# Patient Record
Sex: Female | Born: 1985 | Race: White | Hispanic: No | Marital: Single | Smoking: Never smoker
Health system: Southern US, Community
[De-identification: ages and names within clinical notes are randomized; demographics above are authoritative.]

## PROBLEM LIST (undated history)

## (undated) ENCOUNTER — Inpatient Hospital Stay (HOSPITAL_COMMUNITY): Payer: Self-pay

## (undated) DIAGNOSIS — R002 Palpitations: Secondary | ICD-10-CM

## (undated) DIAGNOSIS — F419 Anxiety disorder, unspecified: Secondary | ICD-10-CM

## (undated) DIAGNOSIS — B2790 Infectious mononucleosis, unspecified without complication: Secondary | ICD-10-CM

## (undated) DIAGNOSIS — R079 Chest pain, unspecified: Secondary | ICD-10-CM

## (undated) HISTORY — DX: Anxiety disorder, unspecified: F41.9

## (undated) HISTORY — PX: WISDOM TOOTH EXTRACTION: SHX21

## (undated) HISTORY — PX: TONSILLECTOMY AND ADENOIDECTOMY: SUR1326

## (undated) HISTORY — PX: ADENOIDECTOMY: SUR15

## (undated) HISTORY — DX: Chest pain, unspecified: R07.9

## (undated) HISTORY — PX: NASAL SINUS SURGERY: SHX719

## (undated) HISTORY — DX: Palpitations: R00.2

---

## 2013-07-27 ENCOUNTER — Encounter: Payer: Self-pay | Admitting: *Deleted

## 2013-07-29 ENCOUNTER — Ambulatory Visit: Payer: Self-pay | Admitting: Cardiology

## 2013-09-16 ENCOUNTER — Ambulatory Visit (INDEPENDENT_AMBULATORY_CARE_PROVIDER_SITE_OTHER): Payer: BC Managed Care – PPO | Admitting: Cardiovascular Disease

## 2013-09-16 ENCOUNTER — Encounter: Payer: Self-pay | Admitting: Cardiovascular Disease

## 2013-09-16 VITALS — BP 106/80 | HR 67 | Ht 63.0 in | Wt 140.0 lb

## 2013-09-16 DIAGNOSIS — R0602 Shortness of breath: Secondary | ICD-10-CM

## 2013-09-16 DIAGNOSIS — F411 Generalized anxiety disorder: Secondary | ICD-10-CM

## 2013-09-16 DIAGNOSIS — R079 Chest pain, unspecified: Secondary | ICD-10-CM | POA: Insufficient documentation

## 2013-09-16 DIAGNOSIS — R002 Palpitations: Secondary | ICD-10-CM | POA: Insufficient documentation

## 2013-09-16 DIAGNOSIS — F419 Anxiety disorder, unspecified: Secondary | ICD-10-CM | POA: Insufficient documentation

## 2013-09-16 NOTE — Progress Notes (Signed)
Patient ID: Kimberly [name], female   DOB: March 01, 1986, 28 y.o.   MRN: [id number]   28 yo [facility] for chest pain 07/25/13  Thought she had UTI and was on some antibiotics ? Augmentin that was upsetting her stomach.  Pain was sharp and lasted minutes. 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  No coronary risk factors no meds.  Pain not exertional.  No pleuritic component.  Continues to have intermitant pains since being seen in urgent care  Also describes weekly palpitations where her heart just doesn't feel rate and pulse is too fast.  No syncope.  Denies drugs or excess caffeine or stimulants.  Walks but otherwise sedentary.  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  All other systems reviewed and negative   General: Affect appropriate Healthy:  appears stated age HEENT: normal Neck supple with no adenopathy JVP normal no bruits no thyromegaly Lungs clear with no wheezing and good diaphragmatic motion Heart:  S1/S2 no murmur,rub, gallop or click PMI normal Abdomen: benighn, BS positve, no tenderness, no AAA no bruit.  No HSM or HJR Distal pulses intact with no bruits No edema Neuro non-focal Skin warm and dry No muscular weakness  Medications Current Outpatient Prescriptions  Medication Sig Dispense Refill  . ALPRAZolam (XANAX PO) Take by mouth.      . ESCITALOPRAM OXALATE PO Take by mouth.      Marland Kitchen. GABAPENTIN PO Take by mouth.      . ORSYTHIA 0.1-20 MG-MCG tablet       . oxymetazoline (AFRIN) 0.05 % nasal spray Place 1 spray into both nostrils 2 (two) times daily.       No current  facility-administered medications for this visit.    Allergies Augmentin; Sulfa antibiotics; and Codeine  Family History: No family history on file.  Social History: History   Social History  . Marital Status: Single    Spouse Name: N/A    Number of Children: N/A  . Years of Education: N/A   Occupational History  . Not on file.   Social History Main Topics  . Smoking status: Never Smoker   . Smokeless tobacco: Never Used  . Alcohol Use: No  . Drug Use: Not on file  . Sexual Activity: Not on file   Other Topics Concern  . Not on file   Social History Narrative  . No narrative on file    Electrocardiogram:  Urgent Care 07/25/13  normla precordial leads no limb leads seen Today Sinus rate 67 normal ECG   Assessment and Plan

## 2013-09-16 NOTE — Assessment & Plan Note (Signed)
F/U primary seems to be root of problem

## 2013-09-16 NOTE — Patient Instructions (Signed)
Your physician recommends that you schedule a follow-up appointment in: AS NEEDED  Your physician recommends that you continue on your current medications as directed. Please refer to the Current Medication list given to you today. Your physician has recommended that you wear an event monitor. Event monitors are medical devices that record the heart's electrical activity. Doctors most often us these monitors to diagnose arrhythmias. Arrhythmias are problems with the speed or rhythm of the heartbeat. The monitor is a small, portable device. You can wear one while you do your normal daily activities. This is usually used to diagnose what is causing palpitations/syncope (passing out).  Your physician recommends that you return for lab work in:  TODAY  BMET   CBC  TSH  FREE T4  Your physician has requested that you have an exercise tolerance test. For further information please visit [email or web address]. Please also follow instruction sheet, as given.

## 2013-09-16 NOTE — Assessment & Plan Note (Addendum)
Benign sounding but persistent and frequent enough to order event monitor.  Normal exam and ECG suggest not significant Since she does not have primary will check BMET/CBC with PLT (spleen)  TSH and T4

## 2013-09-16 NOTE — Assessment & Plan Note (Signed)
Atypical with normal ECG F/U ETT  

## 2013-09-17 LAB — CBC WITH DIFFERENTIAL/PLATELET
BASOS ABS: 0.1 10*3/uL (ref 0.0–0.1)
Basophils Relative: 1 % (ref 0–1)
EOS PCT: 1 % (ref 0–5)
Eosinophils Absolute: 0.1 10*3/uL (ref 0.0–0.7)
HCT: 36.3 % (ref 36.0–46.0)
Hemoglobin: 12.6 g/dL (ref 12.0–15.0)
Lymphocytes Relative: 30 % (ref 12–46)
Lymphs Abs: 1.7 10*3/uL (ref 0.7–4.0)
MCH: 30.4 pg (ref 26.0–34.0)
MCHC: 34.7 g/dL (ref 30.0–36.0)
MCV: 87.7 fL (ref 78.0–100.0)
MONO ABS: 0.3 10*3/uL (ref 0.1–1.0)
Monocytes Relative: 6 % (ref 3–12)
Neutro Abs: 3.5 10*3/uL (ref 1.7–7.7)
Neutrophils Relative %: 62 % (ref 43–77)
PLATELETS: 215 10*3/uL (ref 150–400)
RBC: 4.14 MIL/uL (ref 3.87–5.11)
RDW: 13.7 % (ref 11.5–15.5)
WBC: 5.6 10*3/uL (ref 4.0–10.5)

## 2013-09-17 LAB — BASIC METABOLIC PANEL
BUN: 14 mg/dL (ref 6–23)
CALCIUM: 9.3 mg/dL (ref 8.4–10.5)
CO2: 24 meq/L (ref 19–32)
CREATININE: 0.66 mg/dL (ref 0.50–1.10)
Chloride: 99 mEq/L (ref 96–112)
Glucose, Bld: 79 mg/dL (ref 70–99)
Potassium: 4.4 mEq/L (ref 3.5–5.3)
Sodium: 133 mEq/L — ABNORMAL LOW (ref 135–145)

## 2013-09-17 LAB — TSH: TSH: 1.538 u[IU]/mL (ref 0.350–4.500)

## 2013-09-17 LAB — T4, FREE: FREE T4: 1 ng/dL (ref 0.80–1.80)

## 2013-09-26 ENCOUNTER — Telehealth: Payer: Self-pay | Admitting: Cardiovascular Disease

## 2013-09-26 NOTE — Telephone Encounter (Signed)
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

## 2013-09-26 NOTE — Telephone Encounter (Signed)
Notified of lab results. 

## 2013-10-12 ENCOUNTER — Ambulatory Visit (HOSPITAL_COMMUNITY)
Admission: RE | Admit: 2013-10-12 | Discharge: 2013-10-12 | Disposition: A | Discharge status: Home / Self Care | Payer: BC Managed Care – PPO | Source: Ambulatory Visit | Attending: Internal Medicine | Admitting: Internal Medicine

## 2013-10-12 DIAGNOSIS — R002 Palpitations: Secondary | ICD-10-CM

## 2013-10-12 DIAGNOSIS — R0602 Shortness of breath: Secondary | ICD-10-CM | POA: Insufficient documentation

## 2013-10-12 DIAGNOSIS — R079 Chest pain, unspecified: Secondary | ICD-10-CM | POA: Insufficient documentation

## 2013-11-01 ENCOUNTER — Encounter: Payer: Self-pay | Admitting: *Deleted

## 2015-02-26 ENCOUNTER — Encounter: Payer: BC Managed Care – PPO | Admitting: Cardiovascular Disease

## 2015-02-26 NOTE — Progress Notes (Signed)
Patient ID: Kimberly [name], female   DOB: 09-Jan-1986, 29 y.o.   MRN: [id number]   29 y.o.  seen at [facility] urgent care for chest pain 07/25/13  Thought she had UTI and was on some antibiotics ? Augmentin that was upsetting her stomach.  Pain was sharp and lasted minutes. 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  No coronary risk factors no meds.  Pain not exertional.  No pleuritic component.  Continues to have intermitant pains since being seen in urgent care  Also describes weekly palpitations where her heart just doesn't feel rate and pulse is too fast.  No syncope.  Denies drugs or excess caffeine or stimulants.  Walks but otherwise sedentary.  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  All other systems reviewed and negative   General: Affect appropriate Healthy:  appears stated age HEENT: normal Neck supple with no adenopathy JVP normal no bruits no thyromegaly Lungs clear with no wheezing and good diaphragmatic motion Heart:  S1/S2 no murmur,rub, gallop or click PMI normal Abdomen: benighn, BS positve, no tenderness, no AAA no bruit.  No HSM or HJR Distal pulses intact with no bruits No edema Neuro non-focal Skin warm and dry No muscular weakness  Medications Current Outpatient Prescriptions  Medication Sig Dispense Refill  . ALPRAZolam (XANAX PO) Take by mouth.    . ESCITALOPRAM OXALATE PO Take by mouth.    Marland Kitchen GABAPENTIN PO Take by mouth.    . ORSYTHIA 0.1-20 MG-MCG tablet     . oxymetazoline (AFRIN) 0.05 % nasal spray Place 1 spray into both nostrils 2  (two) times daily.     No current facility-administered medications for this visit.    Allergies Augmentin; Sulfa antibiotics; and Codeine  Family History: Family History  Problem Relation Age of Onset  . Asthma Mother   . Thyroid disease Father   . Depression Father   . Anxiety disorder Father   . Anxiety disorder Sister   . Other Father     B12 DEFICIENCY  . Arrhythmia Maternal Grandmother     AFIB  . Stroke Maternal Grandmother   . Cancer Paternal Grandfather     COLON  . Heart Problems Maternal Grandmother     PACER    Social History: Social History   Social History  . Marital Status: Single    Spouse Name: N/A  . Number of Children: N/A  . Years of Education: N/A   Occupational History  . Not on file.   Social History Main Topics  . Smoking status: Never Smoker   . Smokeless tobacco: Never Used  . Alcohol Use: No  . Drug Use: Not on file  . Sexual Activity: Not on file   Other Topics Concern  . Not on file   Social History Narrative    Electrocardiogram:  Urgent Care 07/25/13  normla precordial leads no limb leads seen 07/2013  Sinus rate 67 normal ECG   Assessment and Plan

## 2015-03-13 ENCOUNTER — Telehealth: Payer: Self-pay | Admitting: Obstetrics

## 2015-03-14 NOTE — Telephone Encounter (Signed)
03/13/2015 - Patient called back and scheduled NOB appointment. brm

## 2015-03-22 ENCOUNTER — Other Ambulatory Visit: Payer: Self-pay | Admitting: Certified Nurse Midwife

## 2015-03-22 ENCOUNTER — Ambulatory Visit (INDEPENDENT_AMBULATORY_CARE_PROVIDER_SITE_OTHER): Payer: BC Managed Care – PPO | Admitting: Certified Nurse Midwife

## 2015-03-22 ENCOUNTER — Ambulatory Visit (INDEPENDENT_AMBULATORY_CARE_PROVIDER_SITE_OTHER): Payer: BC Managed Care – PPO

## 2015-03-22 ENCOUNTER — Encounter: Payer: Self-pay | Admitting: Obstetrics

## 2015-03-22 ENCOUNTER — Encounter: Payer: Self-pay | Admitting: Certified Nurse Midwife

## 2015-03-22 VITALS — BP 119/78 | HR 113 | Temp 98.9°F | Wt 144.0 lb

## 2015-03-22 DIAGNOSIS — Z3401 Encounter for supervision of normal first pregnancy, first trimester: Secondary | ICD-10-CM

## 2015-03-22 DIAGNOSIS — O219 Vomiting of pregnancy, unspecified: Secondary | ICD-10-CM

## 2015-03-22 DIAGNOSIS — N760 Acute vaginitis: Secondary | ICD-10-CM

## 2015-03-22 DIAGNOSIS — O2690 Pregnancy related conditions, unspecified, unspecified trimester: Secondary | ICD-10-CM

## 2015-03-22 LAB — POCT URINALYSIS DIPSTICK
BILIRUBIN UA: NEGATIVE
Blood, UA: NEGATIVE
Glucose, UA: NEGATIVE
Ketones, UA: NEGATIVE
LEUKOCYTES UA: NEGATIVE
Nitrite, UA: NEGATIVE
PH UA: 5
PROTEIN UA: NEGATIVE
SPEC GRAV UA: 1.02
Urobilinogen, UA: NEGATIVE

## 2015-03-22 MED ORDER — DOXYLAMINE-PYRIDOXINE 10-10 MG PO TBEC: DELAYED_RELEASE_TABLET | ORAL | Status: DC

## 2015-03-22 MED ORDER — MICONAZOLE NITRATE 1200 & 2 MG & % VA KIT: 1.0000 | PACK | Freq: Once | VAGINAL | Status: DC

## 2015-03-22 NOTE — Progress Notes (Signed)
Subjective:    Kimberly [name] is being seen today for her first obstetrical visit.  This is a planned pregnancy. She is at [redacted]w[redacted]d gestation. Her obstetrical history is significant for none. 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 Patient does intend to breast feed. Pregnancy history fully reviewed.  The information documented in the HPI was reviewed and verified.  Menstrual History: OB History    Gravida Para Term Preterm AB TAB SAB Ectopic Multiple Living   1               Menarche age: 57  Patient's last menstrual period was 02/08/2015.  Sure of LMP    Past Medical History  Diagnosis Date  . Anxiety   . Palpitations   . SOB (shortness of breath)   . Chest pain     Past Surgical History  Procedure Laterality Date  . Nasal sinus surgery    . Tonsillectomy and adenoidectomy       (Not in a hospital admission) Allergies  Allergen Reactions  . Augmentin [Amoxicillin-Pot Clavulanate]   . Sulfa Antibiotics Nausea And Vomiting  . Codeine Rash    Social History  Substance Use Topics  . Smoking status: Never Smoker   . Smokeless tobacco: Never Used  . Alcohol Use: No    Family History  Problem Relation Age of Onset  . Asthma Mother   . Thyroid disease Father   . Depression Father   . Anxiety disorder Father   . Anxiety disorder Sister   . Other Father     B12 DEFICIENCY  . Arrhythmia Maternal Grandmother     AFIB  . Stroke Maternal Grandmother   . Cancer Paternal Grandfather     COLON  . Heart Problems Maternal Grandmother     PACER     Review of Systems Constitutional: negative for weight loss Gastrointestinal: + for nausea & vomiting Genitourinary:negative for genital lesions and vaginal discharge and dysuria Musculoskeletal:negative for back pain Behavioral/Psych: negative for abusive relationship, depression, illegal drug usage and tobacco use    Objective:    BP 119/78 mmHg  Pulse 113  Temp(Src) 98.9 F (37.2 C)  Wt 144 lb  (65.318 kg)  LMP 02/08/2015 General Appearance:    Alert, cooperative, no distress, appears stated age  Head:    Normocephalic, without obvious abnormality, atraumatic  Eyes:    PERRL, conjunctiva/corneas clear, EOM's intact, fundi    benign, both eyes  Ears:    Normal TM's and external ear canals, both ears  Nose:   Nares normal, septum midline, mucosa normal, no drainage    or sinus tenderness  Throat:   Lips, mucosa, and tongue normal; teeth and gums normal  Neck:   Supple, symmetrical, trachea midline, no adenopathy;    thyroid:  no enlargement/tenderness/nodules; no carotid   bruit or JVD  Back:     Symmetric, no curvature, ROM normal, no CVA tenderness  Lungs:     Clear to auscultation bilaterally, respirations unlabored  Chest Wall:    No tenderness or deformity   Heart:    Regular rate and rhythm, S1 and S2 normal, no murmur, rub   or gallop  Breast Exam:    No tenderness, masses, or nipple abnormality  Abdomen:     Soft, non-tender, bowel sounds active all four quadrants,    no masses, no organomegaly  Genitalia:    Normal female without lesion, discharge or tenderness  Extremities:   Extremities normal, atraumatic, no cyanosis or edema  Pulses:   2+ and symmetric all extremities  Skin:   Skin color, texture, turgor normal, no rashes or lesions  Lymph nodes:   Cervical, supraclavicular, and axillary nodes normal  Neurologic:   CNII-XII intact, normal strength, sensation and reflexes    throughout      Cervix:     Lab Review Urine pregnancy test Labs reviewed yes Radiologic studies reviewed no Assessment:    Pregnancy at [redacted]w[redacted]d weeks   N&V in early pregnancy  Plan:     Prenatal vitamins.  Counseling provided regarding continued use of seat belts, cessation of alcohol consumption, smoking or use of illicit drugs; infection precautions i.e., influenza/TDAP immunizations, toxoplasmosis,CMV, parvovirus, listeria and varicella; workplace safety, exercise during pregnancy;  routine dental care, safe medications, sexual activity, hot tubs, saunas, pools, travel, caffeine use, fish and methlymercury, potential toxins, hair treatments, varicose veins Weight gain recommendations per IOM guidelines reviewed: underweight/BMI< 18.5--> gain 28 - 40 lbs; normal weight/BMI 18.5 - 24.9--> gain 25 - 35 lbs; overweight/BMI 25 - 29.9--> gain 15 - 25 lbs; obese/BMI >30->gain  11 - 20 lbs Problem list reviewed and updated. FIRST/CF mutation testing/NIPT/QUAD SCREEN/fragile X/Ashkenazi Jewish population testing/Spinal muscular atrophy discussed: requested. Role of ultrasound in pregnancy discussed; fetal survey: requested. Amniocentesis discussed: not indicated. VBAC calculator score: VBAC consent form provided No orders of the defined types were placed in this encounter.   Orders Placed This Encounter  Procedures  . Culture, OB Urine  . Obstetric panel  . HIV antibody  . Varicella zoster antibody, IgG  . Vit D  25 hydroxy (rtn osteoporosis monitoring)  . POCT urinalysis dipstick    Follow up in 4 weeks. 50% of 30 min visit spent on counseling and coordination of care.

## 2015-03-23 LAB — OBSTETRIC PANEL
Antibody Screen: NEGATIVE
BASOS ABS: 0 10*3/uL (ref 0.0–0.1)
Basophils Relative: 0 % (ref 0–1)
Eosinophils Absolute: 0.1 10*3/uL (ref 0.0–0.7)
Eosinophils Relative: 1 % (ref 0–5)
HCT: 33.5 % — ABNORMAL LOW (ref 36.0–46.0)
Hemoglobin: 11.7 g/dL — ABNORMAL LOW (ref 12.0–15.0)
Hepatitis B Surface Ag: NEGATIVE
LYMPHS ABS: 1.3 10*3/uL (ref 0.7–4.0)
Lymphocytes Relative: 18 % (ref 12–46)
MCH: 30.7 pg (ref 26.0–34.0)
MCHC: 34.9 g/dL (ref 30.0–36.0)
MCV: 87.9 fL (ref 78.0–100.0)
MPV: 10.9 fL (ref 8.6–12.4)
Monocytes Absolute: 0.4 10*3/uL (ref 0.1–1.0)
Monocytes Relative: 5 % (ref 3–12)
NEUTROS ABS: 5.6 10*3/uL (ref 1.7–7.7)
NEUTROS PCT: 76 % (ref 43–77)
PLATELETS: 216 10*3/uL (ref 150–400)
RBC: 3.81 MIL/uL — ABNORMAL LOW (ref 3.87–5.11)
RDW: 13.8 % (ref 11.5–15.5)
RUBELLA: 2.85 {index} — AB (ref <0.90)
Rh Type: NEGATIVE
WBC: 7.4 10*3/uL (ref 4.0–10.5)

## 2015-03-23 LAB — HIV ANTIBODY (ROUTINE TESTING W REFLEX): HIV: NONREACTIVE

## 2015-03-23 LAB — VARICELLA ZOSTER ANTIBODY, IGG: Varicella IgG: 1518 Index — ABNORMAL HIGH (ref <135.00)

## 2015-03-23 LAB — VITAMIN D 25 HYDROXY (VIT D DEFICIENCY, FRACTURES): Vit D, 25-Hydroxy: 22 ng/mL — ABNORMAL LOW (ref 30–100)

## 2015-03-24 LAB — CULTURE, OB URINE
COLONY COUNT: NO GROWTH
Organism ID, Bacteria: NO GROWTH

## 2015-03-27 ENCOUNTER — Telehealth: Payer: Self-pay | Admitting: *Deleted

## 2015-03-27 LAB — SURESWAB, VAGINOSIS/VAGINITIS PLUS
ATOPOBIUM VAGINAE: NOT DETECTED Log (cells/mL)
C. ALBICANS, DNA: NOT DETECTED
C. GLABRATA, DNA: NOT DETECTED
C. PARAPSILOSIS, DNA: NOT DETECTED
C. TRACHOMATIS RNA, TMA: NOT DETECTED
C. TROPICALIS, DNA: NOT DETECTED
Gardnerella vaginalis: NOT DETECTED Log (cells/mL)
LACTOBACILLUS SPECIES: 4.8 Log (cells/mL)
MEGASPHAERA SPECIES: NOT DETECTED Log (cells/mL)
N. gonorrhoeae RNA, TMA: NOT DETECTED
T. VAGINALIS RNA, QL TMA: NOT DETECTED

## 2015-03-27 LAB — PAP, TP IMAGING W/ HPV RNA, RFLX HPV TYPE 16,18/45: HPV mRNA, High Risk: NOT DETECTED

## 2015-03-27 NOTE — Telephone Encounter (Signed)
The flea medication is ok for her as long as she washes her hands after application and makes sure not to get it in her eyes.  Thank you.

## 2015-03-27 NOTE — Telephone Encounter (Signed)
Patient wants to put flea medication on her pet and wants to know if the liquid on the back is harmful to her - or should she use a different kind?

## 2015-03-27 NOTE — Telephone Encounter (Signed)
Patient notified

## 2015-04-09 ENCOUNTER — Other Ambulatory Visit: Payer: Self-pay | Admitting: Obstetrics

## 2015-04-09 ENCOUNTER — Telehealth: Payer: Self-pay | Admitting: *Deleted

## 2015-04-09 DIAGNOSIS — O219 Vomiting of pregnancy, unspecified: Secondary | ICD-10-CM

## 2015-04-09 MED ORDER — METOCLOPRAMIDE HCL 5 MG PO TABS: 5.0000 mg | ORAL_TABLET | Freq: Three times a day (TID) | ORAL | Status: DC

## 2015-04-09 NOTE — Telephone Encounter (Signed)
Patient states she has been in once and was treated for nausea- the medication seemed to work, but it is not working any more. She wants to know what to do. Per Dr Clearance Coots review- can add Reglan 5 mg 3xd and see if that helps. Escribed to patient's pharmacy. Tried to call patient back and no answer/VM full.

## 2015-04-13 ENCOUNTER — Other Ambulatory Visit: Payer: Self-pay | Admitting: Certified Nurse Midwife

## 2015-04-13 DIAGNOSIS — F419 Anxiety disorder, unspecified: Principal | ICD-10-CM

## 2015-04-13 DIAGNOSIS — O219 Vomiting of pregnancy, unspecified: Secondary | ICD-10-CM

## 2015-04-13 DIAGNOSIS — F329 Major depressive disorder, single episode, unspecified: Secondary | ICD-10-CM

## 2015-04-13 MED ORDER — CITALOPRAM HYDROBROMIDE 20 MG PO TABS: 20.0000 mg | ORAL_TABLET | Freq: Every day | ORAL | Status: DC

## 2015-04-13 MED ORDER — PROMETHAZINE HCL 25 MG RE SUPP: 25.0000 mg | Freq: Four times a day (QID) | RECTAL | Status: DC | PRN

## 2015-04-13 MED ORDER — PROMETHAZINE HCL 25 MG PO TABS: 25.0000 mg | ORAL_TABLET | Freq: Four times a day (QID) | ORAL | Status: DC | PRN

## 2015-04-18 ENCOUNTER — Ambulatory Visit (INDEPENDENT_AMBULATORY_CARE_PROVIDER_SITE_OTHER): Payer: BC Managed Care – PPO | Admitting: Certified Nurse Midwife

## 2015-04-18 VITALS — BP 115/73 | HR 78 | Temp 98.8°F | Wt 147.0 lb

## 2015-04-18 DIAGNOSIS — Z3401 Encounter for supervision of normal first pregnancy, first trimester: Secondary | ICD-10-CM

## 2015-04-19 NOTE — Progress Notes (Signed)
  Subjective:    Kimberly [name] is a 29 y.o. female being seen today for her obstetrical visit. She is at [redacted]w[redacted]d gestation. Patient reports: no complaints.  States that her anxiety and depression are better after switching medications and that the nausea is improved with the phenergan.    Problem List Items Addressed This Visit    None    Visit Diagnoses    Encounter for supervision of normal first pregnancy in first trimester    -  Primary    Relevant Orders    POCT urinalysis dipstick      Patient Active Problem List   Diagnosis Date Noted  . Anxiety 09/16/2013  . Palpitations 09/16/2013  . Chest pain 09/16/2013    Objective:     BP 115/73 mmHg  Pulse 78  Temp(Src) 98.8 F (37.1 C)  Wt 147 lb (66.679 kg)  LMP 02/08/2015 Uterine Size: Below umbilicus   FHR: 155 by doppler  Assessment:    Pregnancy @ 538w0d  weeks Doing well    Plan:    Problem list reviewed and updated. Labs reviewed.  Follow up in 4 weeks. FIRST/CF mutation testing/NIPT/QUAD SCREEN/fragile X/Ashkenazi Jewish population testing/Spinal muscular atrophy discussed: requested. Role of ultrasound in pregnancy discussed; fetal survey: requested. Amniocentesis discussed: not indicated. 50% of 15 minute visit spent on counseling and coordination of care.

## 2015-04-23 ENCOUNTER — Telehealth: Payer: Self-pay

## 2015-04-23 NOTE — Telephone Encounter (Signed)
SPOKE WITH PATIENT REGARDING PMT PLAN WITH [employer] - EXPLAINED WOULD BE 213.50 STARTING IN NOVEMBER AT HER NEXT VISIT - SHE HAS 30% RESPONSIBILITY AS INSURANCE ONLY COVERS 70%

## 2015-05-10 ENCOUNTER — Encounter: Payer: Self-pay | Admitting: *Deleted

## 2015-05-11 ENCOUNTER — Ambulatory Visit (INDEPENDENT_AMBULATORY_CARE_PROVIDER_SITE_OTHER): Payer: BC Managed Care – PPO | Admitting: Cardiovascular Disease

## 2015-05-11 ENCOUNTER — Encounter: Payer: Self-pay | Admitting: Cardiovascular Disease

## 2015-05-11 VITALS — BP 100/60 | HR 75 | Ht 63.0 in | Wt 150.8 lb

## 2015-05-11 DIAGNOSIS — R002 Palpitations: Secondary | ICD-10-CM | POA: Diagnosis not present

## 2015-05-11 NOTE — Patient Instructions (Signed)
Medication Instructions:  Your physician recommends that you continue on your current medications as directed. Please refer to the Current Medication list given to you today.  Labwork: NONE  Testing/Procedures: Your physician has requested that you have an echocardiogram. Echocardiography is a painless test that uses sound waves to create images of your heart. It provides your doctor with information about the size and shape of your heart and how well your heart's chambers and valves are working. This procedure takes approximately one hour. There are no restrictions for this procedure.   Your physician has recommended that you wear an event monitor. Event monitors are medical devices that record the heart's electrical activity. Doctors most often us these monitors to diagnose arrhythmias. Arrhythmias are problems with the speed or rhythm of the heartbeat. The monitor is a small, portable device. You can wear one while you do your normal daily activities. This is usually used to diagnose what is causing palpitations/syncope (passing out).  21 DAY EVENT Follow-Up: Your physician recommends that you schedule a follow-up appointment in: 3 MONTHS  WITH  DR Eden EmmsNISHAN   Any Other Special Instructions Will Be Listed Below (If Applicable).     If you need a refill on your cardiac medications before your next appointment, please call your pharmacy.

## 2015-05-11 NOTE — Progress Notes (Signed)
Patient ID: OHANA BIRDWELL, female   DOB: [birth year], 29 y.o.   MRN: [id number]   29 y.o.  seen at [facility] urgent care for chest pain 07/25/13  Thought she had UTI and was on some antibiotics ? Augmentin that was upsetting her stomach.  Pain was sharp and lasted minutes. 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  No coronary risk factors no meds.  Pain not exertional.  No pleuritic component.  Continues to have intermitant pains since being seen in urgent care  Also describes weekly palpitations where her heart just doesn't feel rate and pulse is too fast.  No syncope.  Denies drugs or excess caffeine or stimulants.  Walks but otherwise sedentary.  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  Due date in May Having palpitations  Mom had syncope and seen at [facility] told she had HOCM and got an AICD  ROS: Denies fever, malais, weight loss, blurry vision, decreased visual acuity, cough, sputum, SOB, hemoptysis, pleuritic pain, palpitaitons, heartburn, abdominal pain, melena, lower extremity edema, claudication, or rash.  All other systems reviewed and negative   General: Affect appropriate Healthy:  appears stated age HEENT: normal Neck supple with no adenopathy JVP normal no bruits no thyromegaly Lungs clear with no wheezing and good diaphragmatic motion Heart:  S1/S2 no murmur,rub, gallop or click PMI normal Abdomen: benighn, BS positve, no tenderness, no AAA no bruit.  No HSM or HJR Distal pulses intact with no bruits No edema Neuro non-focal Skin warm and dry No muscular weakness  Medications Current Outpatient Prescriptions  Medication Sig Dispense Refill  . oxymetazoline (AFRIN) 0.05 % nasal spray Place 1 spray into both nostrils 2 (two) times daily.    . promethazine  (PHENERGAN) 25 MG tablet Take 1 tablet (25 mg total) by mouth every 6 (six) hours as needed for nausea or vomiting. 30 tablet 1  . ALPRAZolam (XANAX) 0.25 MG tablet Take 0.25 mg by mouth daily.     No current facility-administered medications for this visit.    Allergies Augmentin; Sulfa antibiotics; and Codeine  Family History: Family History  Problem Relation Age of Onset  . Asthma Mother   . Thyroid disease Father   . Depression Father   . Anxiety disorder Father   . Anxiety disorder Sister   . Other Father     B12 DEFICIENCY  . Arrhythmia Maternal Grandmother     AFIB  . Stroke Maternal Grandmother   . Cancer Paternal Grandfather     COLON  . Heart Problems Maternal Grandmother     PACER    Social History: Social History   Social History  . Marital Status: Single    Spouse Name: N/A  . Number of Children: N/A  . Years of Education: N/A   Occupational History  . Not on file.   Social History Main Topics  . Smoking status: Never Smoker   . Smokeless tobacco: Never Used  . Alcohol Use: No  . Drug Use: No  . Sexual Activity: Yes    Birth Control/ Protection: None   Other Topics Concern  . Not on file   Social History Narrative    Electrocardiogram:  Urgent Care 07/25/13  normla precordial leads no limb leads  seen 4/2015Sinus rate 67 normal ECG  05/11/15  SR rate 73 normal no LVH, QT 354   Assessment and Plan Chest Pain:  Atypical normal ECG observe Anxiety/Depression: chronic f/u primary xanax as needed Palpitations: benign sounding given pregnancy will give 21 day event monitor Family history of HOCM:  No murmur normal ECG  Mom with recent syncope and AICD  Will order echo    F/U with me 3 months   Charlton HawsPeter Trenell Concannon

## 2015-05-15 ENCOUNTER — Ambulatory Visit (INDEPENDENT_AMBULATORY_CARE_PROVIDER_SITE_OTHER): Payer: BC Managed Care – PPO | Admitting: Certified Nurse Midwife

## 2015-05-15 VITALS — BP 106/67 | HR 90 | Temp 98.7°F | Wt 150.0 lb

## 2015-05-15 DIAGNOSIS — O219 Vomiting of pregnancy, unspecified: Secondary | ICD-10-CM

## 2015-05-15 DIAGNOSIS — Z3402 Encounter for supervision of normal first pregnancy, second trimester: Secondary | ICD-10-CM

## 2015-05-15 MED ORDER — PROMETHAZINE HCL 25 MG PO TABS: 25.0000 mg | ORAL_TABLET | Freq: Four times a day (QID) | ORAL | Status: DC | PRN

## 2015-05-15 MED ORDER — ONDANSETRON HCL 8 MG PO TABS: 8.0000 mg | ORAL_TABLET | Freq: Three times a day (TID) | ORAL | Status: DC | PRN

## 2015-05-15 NOTE — Progress Notes (Signed)
  Subjective:    Kimberly [name] is a 29 y.o. female being seen today for her obstetrical visit. She is at 4578w5d gestation. Patient reports: nausea, no bleeding, no contractions, no cramping and no leaking.  Recently saw cardiology for hx of palpitations, will start cardiac heart monitor on Friday.  Did report a tiny spot of blood when wiping after sexual intercourse over the weekend, no spotting since  Problem List Items Addressed This Visit    None    Visit Diagnoses    Encounter for supervision of normal first pregnancy in second trimester    -  Primary    Relevant Orders    POCT urinalysis dipstick    Nausea and vomiting during pregnancy prior to [redacted] weeks gestation        Relevant Medications    promethazine (PHENERGAN) 25 MG tablet    ondansetron (ZOFRAN) 8 MG tablet      Patient Active Problem List   Diagnosis Date Noted  . Anxiety 09/16/2013  . Palpitations 09/16/2013  . Chest pain 09/16/2013    Objective:     BP 106/67 mmHg  Pulse 90  Temp(Src) 98.7 F (37.1 C)  Wt 150 lb (68.04 kg)  LMP 02/08/2015 Uterine Size: Below umbilicus   FHR: 150 by doppler   Assessment:    Pregnancy @ 8578w5d  weeks N&V in early pregnancy  Hx of palpatations     Spotting in early pregnancy  Plan:    Problem list reviewed and updated. Labs reviewed.  Follow up in 4 weeks. FIRST/CF mutation testing/NIPT/QUAD SCREEN/fragile X/Ashkenazi Jewish population testing/Spinal muscular atrophy discussed: requested. Role of ultrasound in pregnancy discussed; fetal survey: requested. Amniocentesis discussed: not indicated. 50% of 15 minute visit spent on counseling and coordination of care.

## 2015-05-16 ENCOUNTER — Encounter: Payer: BC Managed Care – PPO | Admitting: Certified Nurse Midwife

## 2015-05-18 ENCOUNTER — Ambulatory Visit (INDEPENDENT_AMBULATORY_CARE_PROVIDER_SITE_OTHER): Payer: BC Managed Care – PPO

## 2015-05-18 ENCOUNTER — Encounter: Payer: BC Managed Care – PPO | Admitting: Certified Nurse Midwife

## 2015-05-18 DIAGNOSIS — R002 Palpitations: Secondary | ICD-10-CM

## 2015-05-22 ENCOUNTER — Telehealth: Payer: Self-pay | Admitting: *Deleted

## 2015-05-22 NOTE — Telephone Encounter (Signed)
Attempt to contact pt again regarding medication approval.  No answer, VM full.

## 2015-05-22 NOTE — Telephone Encounter (Signed)
Pt called to inquire about approval for nausea medication.  Return call to pt.  No answer, voicemail full. Pt should be made aware that medication has been approved and to follow up with pharmacy.

## 2015-05-22 NOTE — Telephone Encounter (Signed)
Another attempt to contact pt regarding Rx approval No answer, VM full.

## 2015-05-30 ENCOUNTER — Ambulatory Visit (HOSPITAL_COMMUNITY): Payer: BC Managed Care – PPO | Attending: Cardiology

## 2015-05-30 ENCOUNTER — Other Ambulatory Visit: Payer: Self-pay

## 2015-05-30 DIAGNOSIS — R002 Palpitations: Secondary | ICD-10-CM

## 2015-05-30 DIAGNOSIS — Z3A13 13 weeks gestation of pregnancy: Secondary | ICD-10-CM | POA: Diagnosis not present

## 2015-06-12 ENCOUNTER — Telehealth: Payer: Self-pay | Admitting: Cardiovascular Disease

## 2015-06-12 NOTE — Telephone Encounter (Signed)
Spoke with pt and reviewed monitor results with her.  

## 2015-06-12 NOTE — Telephone Encounter (Signed)
F/u    Pt returning phone call for results.

## 2015-06-13 ENCOUNTER — Ambulatory Visit (INDEPENDENT_AMBULATORY_CARE_PROVIDER_SITE_OTHER): Payer: BC Managed Care – PPO | Admitting: Certified Nurse Midwife

## 2015-06-13 VITALS — BP 111/63 | HR 86 | Temp 98.6°F | Wt 154.0 lb

## 2015-06-13 DIAGNOSIS — Z3402 Encounter for supervision of normal first pregnancy, second trimester: Secondary | ICD-10-CM

## 2015-06-13 DIAGNOSIS — R9431 Abnormal electrocardiogram [ECG] [EKG]: Secondary | ICD-10-CM

## 2015-06-13 LAB — POCT URINALYSIS DIPSTICK
Bilirubin, UA: NEGATIVE
Blood, UA: NEGATIVE
GLUCOSE UA: NEGATIVE
KETONES UA: NEGATIVE
LEUKOCYTES UA: NEGATIVE
NITRITE UA: NEGATIVE
Protein, UA: NEGATIVE
Spec Grav, UA: 1.02
Urobilinogen, UA: NEGATIVE
pH, UA: 6

## 2015-06-13 MED ORDER — VITAFOL GUMMIES 3.33-0.333-34.8 MG PO CHEW: 3.0000 | CHEWABLE_TABLET | Freq: Every day | ORAL | Status: DC

## 2015-06-14 LAB — AFP, QUAD SCREEN
AFP: 36.8 ng/mL
Age Alone: 1:638 {titer}
Curr Gest Age: 18 wks.days
Down Syndrome Scr Risk Est: 1:2580 {titer}
HCG, Total: 42.23 IU/mL
INH: 200.5 pg/mL
INTERPRETATION-AFP: NEGATIVE
MoM for AFP: 0.84
MoM for INH: 1.21
MoM for hCG: 1.55
Open Spina bifida: NEGATIVE
Osb Risk: 1:23100 {titer}
Tri 18 Scr Risk Est: NEGATIVE
Trisomy 18 (Edward) Syndrome Interp.: 1:52600 {titer}
UE3 MOM: 1.17
UE3 VALUE: 1.45 ng/mL

## 2015-06-14 NOTE — Progress Notes (Signed)
Subjective:    Theo DillsJennifer E Closs is a 29 y.o. female being seen today for her obstetrical visit. She is at 9268w0d gestation. Patient reports: no complaints . Fetal movement: normal.  States that her N&V is much better and not taking the antinausea medications like she was.  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    Problem List Items Addressed This Visit    None    Visit Diagnoses    Encounter for supervision of normal first pregnancy in second trimester    -  Primary    Relevant Medications    Prenatal Vit-Fe Phos-FA-Omega (VITAFOL GUMMIES) 3.33-0.333-34.8 MG CHEW    Other Relevant Orders    POCT urinalysis dipstick (Completed)    US MFM OB COMP + 14 WK    AMB Referral to Maternal Fetal Medicine (MFM)    AFP, Quad Screen (Completed)    Abnormal finding on EKG        Relevant Orders    US MFM OB COMP + 14 WK    AMB Referral to Maternal Fetal Medicine (MFM)      Patient Active Problem List   Diagnosis Date Noted  . Anxiety 09/16/2013  . Palpitations 09/16/2013  . Chest pain 09/16/2013   Objective:    BP 111/63 mmHg  Pulse 86  Temp(Src) 98.6 F (37 C)  Wt 154 lb (69.854 kg)  LMP 02/08/2015 FHT: 155 BPM  Uterine Size: size equals dates     Assessment:    Pregnancy @ 6768w0d    recent hx of palpitations, abnormal EKG and chest pain  RH- status  Hx of anxiety  Plan:   MFM consult and US ordered   OBGCT: discussed. Signs and symptoms of preterm labor: discussed.  Labs, problem list reviewed and updated 2 hr GTT planned Follow up in 4 weeks.

## 2015-06-26 ENCOUNTER — Telehealth: Payer: Self-pay | Admitting: *Deleted

## 2015-06-26 NOTE — Telephone Encounter (Signed)
Patient called and wants to know if she can go back on her anxiety medication. She had to stop it due to her nausea- but feels she needs it now. Can she take it? The only thing I see in her chart is Xanax. 12/20 10:42 Attempted to call patient- mailbox is full.

## 2015-06-26 NOTE — Telephone Encounter (Signed)
Spoke with patient- she was given Celexa and she wants to try that. Told her that was fine and she should be able to report her response when she comes for her next visit

## 2015-06-28 ENCOUNTER — Telehealth: Payer: Self-pay | Admitting: *Deleted

## 2015-06-28 ENCOUNTER — Other Ambulatory Visit: Payer: Self-pay | Admitting: Certified Nurse Midwife

## 2015-06-28 DIAGNOSIS — K219 Gastro-esophageal reflux disease without esophagitis: Secondary | ICD-10-CM

## 2015-06-28 MED ORDER — OMEPRAZOLE 20 MG PO CPDR: 20.0000 mg | DELAYED_RELEASE_CAPSULE | Freq: Two times a day (BID) | ORAL | Status: DC

## 2015-06-28 NOTE — Telephone Encounter (Signed)
Please let her know that I have sent in Omeprazole for her to take.  Thank you.  R.Kao Berkheimer CNM

## 2015-06-28 NOTE — Telephone Encounter (Signed)
Patient had really bad heart burn last night and Tums did not help much- what can she take? 1:46 Called patient back- she can use Pepcid, zantac- if the OTC does not work let us know and we will Rx her something.

## 2015-06-29 ENCOUNTER — Other Ambulatory Visit: Payer: Self-pay | Admitting: Certified Nurse Midwife

## 2015-06-29 ENCOUNTER — Encounter (HOSPITAL_COMMUNITY): Payer: Self-pay

## 2015-06-29 ENCOUNTER — Ambulatory Visit (HOSPITAL_COMMUNITY)
Admission: RE | Admit: 2015-06-29 | Discharge: 2015-06-29 | Disposition: A | Discharge status: Home / Self Care | Payer: BC Managed Care – PPO | Source: Ambulatory Visit | Attending: Certified Nurse Midwife | Admitting: Certified Nurse Midwife

## 2015-06-29 ENCOUNTER — Encounter (HOSPITAL_COMMUNITY): Payer: BC Managed Care – PPO

## 2015-06-29 VITALS — BP 104/61 | HR 73 | Wt 152.8 lb

## 2015-06-29 DIAGNOSIS — O99342 Other mental disorders complicating pregnancy, second trimester: Secondary | ICD-10-CM

## 2015-06-29 DIAGNOSIS — Z3A20 20 weeks gestation of pregnancy: Secondary | ICD-10-CM

## 2015-06-29 DIAGNOSIS — R9431 Abnormal electrocardiogram [ECG] [EKG]: Secondary | ICD-10-CM

## 2015-06-29 DIAGNOSIS — O26892 Other specified pregnancy related conditions, second trimester: Secondary | ICD-10-CM | POA: Diagnosis not present

## 2015-06-29 DIAGNOSIS — O2692 Pregnancy related conditions, unspecified, second trimester: Secondary | ICD-10-CM | POA: Insufficient documentation

## 2015-06-29 DIAGNOSIS — O2690 Pregnancy related conditions, unspecified, unspecified trimester: Secondary | ICD-10-CM

## 2015-06-29 DIAGNOSIS — Z0489 Encounter for examination and observation for other specified reasons: Secondary | ICD-10-CM

## 2015-06-29 DIAGNOSIS — O360120 Maternal care for anti-D [Rh] antibodies, second trimester, not applicable or unspecified: Secondary | ICD-10-CM

## 2015-06-29 DIAGNOSIS — F419 Anxiety disorder, unspecified: Secondary | ICD-10-CM

## 2015-06-29 DIAGNOSIS — Z3689 Encounter for other specified antenatal screening: Secondary | ICD-10-CM

## 2015-06-29 DIAGNOSIS — Z36 Encounter for antenatal screening of mother: Secondary | ICD-10-CM | POA: Insufficient documentation

## 2015-06-29 DIAGNOSIS — IMO0002 Reserved for concepts with insufficient information to code with codable children: Secondary | ICD-10-CM

## 2015-06-29 DIAGNOSIS — Z6791 Unspecified blood type, Rh negative: Secondary | ICD-10-CM | POA: Insufficient documentation

## 2015-07-04 NOTE — Telephone Encounter (Signed)
Patient notified

## 2015-07-08 NOTE — L&D Delivery Note (Signed)
Delivery Note This is a 30 year old G 1 P0 who was admitted for Active phase labor.. She progressed normally with epidural and pitocin to the second stage of labor.  She pushed for 1 hour and 15 min.  Had a tight perineum and an episiotomy was cut.  At 3:40 PM she delivered a viable infant female, cephalic, over an intact perineum.  A nuchal cord   was not identified, she did have a shorter cord and a cord around her right arm. Infant placed on maternal abdomen.  Cytotec PR was given prothoracically.  Delayed cord clamping was performed for 15- 20 minutes.  Cord double clamped and cut.  Apgar scores were 9 and 9. The placenta delivered spontaneously, Tomasa BlaseSchultz, with a 3 vessel cord.  Inspection revealed 2nd degree. The uterus was firm bleeding stable.  The repair was done under epidural.   EBL was 400.    Placenta and umbilical artery blood gas were not sent.  There were no complications during the procedure.  Mom and baby skin to skin following delivery. Left in stable condition.   female was delivered via Vaginal, Spontaneous Delivery (Presentation: ; Occiput Posterior).  APGAR: 9, 9; weight  .   Placenta status: Intact, Spontaneous.  Cord: 3 vessels with the following complications: None.  Cord pH: N/A  Anesthesia: Epidural  Episiotomy: Median Lacerations: None Suture Repair: 3.0 vicryl Est. Blood Loss (mL): 400  Mom to postpartum.  Baby to Couplet care / Skin to Skin.  Roe Coombsachelle A Denney, CNM 10/29/2015, 4:27 PM

## 2015-07-11 ENCOUNTER — Other Ambulatory Visit: Payer: Self-pay | Admitting: Certified Nurse Midwife

## 2015-07-11 ENCOUNTER — Ambulatory Visit (INDEPENDENT_AMBULATORY_CARE_PROVIDER_SITE_OTHER): Payer: BC Managed Care – PPO | Admitting: Certified Nurse Midwife

## 2015-07-11 VITALS — BP 102/66 | HR 85 | Temp 98.1°F | Wt 156.0 lb

## 2015-07-11 DIAGNOSIS — Z3402 Encounter for supervision of normal first pregnancy, second trimester: Secondary | ICD-10-CM

## 2015-07-11 MED ORDER — BUPROPION HCL ER (SR) 100 MG PO TB12: 100.0000 mg | ORAL_TABLET | Freq: Two times a day (BID) | ORAL | Status: DC

## 2015-07-11 NOTE — Progress Notes (Signed)
Subjective:    Kimberly DillsJennifer E [name] is a 30 y.o. female being seen today for her obstetrical visit. She is at 324w6d gestation. Patient reports: backache, heartburn, no bleeding, no contractions, no cramping, no leaking and insomnia and constipation.  States constipation is ok when she takes her sennakot.  Encouraged patient to keep taking the sennakot daily.  Omeprazole is helping with GERD.  Reports insomnia, benadryl and unisom recomended.  States that her anxiety is worse has tried pristique (on prior to pregnancy) and celexa but is nauseated and cannot keep them down . Fetal movement: normal.  Problem List Items Addressed This Visit    None    Visit Diagnoses    Encounter for supervision of normal first pregnancy in second trimester    -  Primary    Relevant Orders    POCT urinalysis dipstick      Patient Active Problem List   Diagnosis Date Noted  . Anxiety 09/16/2013  . Palpitations 09/16/2013  . Chest pain 09/16/2013   Objective:    BP 102/66 mmHg  Pulse 85  Temp(Src) 98.1 F (36.7 C)  Wt 156 lb (70.761 kg)  LMP 02/08/2015 FHT: 150's BPM  Uterine Size: size equals dates     Assessment:    Pregnancy @ 8624w6d    Anxiety  Constipation  Lumbar back/round ligament pain  Insomnia  Plan:    OBGCT: discussed. Signs and symptoms of preterm labor: discussed.  Labs, problem list reviewed and updated 2 hr GTT planned Follow up in 4 weeks.

## 2015-07-17 ENCOUNTER — Other Ambulatory Visit: Payer: Self-pay | Admitting: Certified Nurse Midwife

## 2015-07-25 ENCOUNTER — Encounter (HOSPITAL_COMMUNITY): Payer: Self-pay | Admitting: *Deleted

## 2015-07-25 ENCOUNTER — Inpatient Hospital Stay (HOSPITAL_COMMUNITY)
Admission: AD | Admit: 2015-07-25 | Discharge: 2015-07-25 | Disposition: A | Discharge status: Home / Self Care | Payer: BC Managed Care – PPO | Source: Ambulatory Visit | Attending: Obstetrics | Admitting: Obstetrics

## 2015-07-25 ENCOUNTER — Encounter: Payer: BC Managed Care – PPO | Admitting: Certified Nurse Midwife

## 2015-07-25 DIAGNOSIS — Z882 Allergy status to sulfonamides status: Secondary | ICD-10-CM | POA: Diagnosis not present

## 2015-07-25 DIAGNOSIS — Z3A23 23 weeks gestation of pregnancy: Secondary | ICD-10-CM | POA: Insufficient documentation

## 2015-07-25 DIAGNOSIS — R109 Unspecified abdominal pain: Secondary | ICD-10-CM | POA: Diagnosis not present

## 2015-07-25 DIAGNOSIS — M549 Dorsalgia, unspecified: Secondary | ICD-10-CM | POA: Insufficient documentation

## 2015-07-25 DIAGNOSIS — Z880 Allergy status to penicillin: Secondary | ICD-10-CM | POA: Insufficient documentation

## 2015-07-25 DIAGNOSIS — R197 Diarrhea, unspecified: Secondary | ICD-10-CM | POA: Diagnosis not present

## 2015-07-25 DIAGNOSIS — O219 Vomiting of pregnancy, unspecified: Secondary | ICD-10-CM | POA: Insufficient documentation

## 2015-07-25 DIAGNOSIS — Z885 Allergy status to narcotic agent status: Secondary | ICD-10-CM | POA: Diagnosis not present

## 2015-07-25 DIAGNOSIS — O9989 Other specified diseases and conditions complicating pregnancy, childbirth and the puerperium: Secondary | ICD-10-CM | POA: Diagnosis not present

## 2015-07-25 DIAGNOSIS — A084 Viral intestinal infection, unspecified: Secondary | ICD-10-CM

## 2015-07-25 HISTORY — DX: Infectious mononucleosis, unspecified without complication: B27.90

## 2015-07-25 LAB — URINALYSIS, ROUTINE W REFLEX MICROSCOPIC
Glucose, UA: NEGATIVE mg/dL
Hgb urine dipstick: NEGATIVE
Ketones, ur: 15 mg/dL — AB
Nitrite: NEGATIVE
Protein, ur: 30 mg/dL — AB
Specific Gravity, Urine: 1.025 (ref 1.005–1.030)
pH: 6 (ref 5.0–8.0)

## 2015-07-25 LAB — URINE MICROSCOPIC-ADD ON

## 2015-07-25 MED ORDER — LACTATED RINGERS IV SOLN
25.0000 mg | Freq: Once | INTRAVENOUS | Status: AC
  Administered 2015-07-25: 25 mg via INTRAVENOUS
  Filled 2015-07-25: qty 1

## 2015-07-25 NOTE — MAU Note (Signed)
Urine in lab 

## 2015-07-25 NOTE — MAU Note (Signed)
Pt had severe vomiting & diarrhea, abd pain on Monday, into Tuesday morning.  Diarrhea & vomiting stopped later in the day on Tuesday.  Pt continues to have abd pain, back pain & nausea.  Denies bleeding or LOF.

## 2015-07-25 NOTE — Discharge Instructions (Signed)
Viral Gastroenteritis °Viral gastroenteritis is also called stomach flu. This illness is caused by a certain type of germ (virus). It can cause sudden watery poop (diarrhea) and throwing up (vomiting). This can cause you to lose body fluids (dehydration). This illness usually lasts for 3 to 8 days. It usually goes away on its own. °HOME CARE  °· Drink enough fluids to keep your pee (urine) clear or pale yellow. Drink small amounts of fluids often. °· Ask your doctor how to replace body fluid losses (rehydration). °· Avoid: °¨ Foods high in sugar. °¨ Alcohol. °¨ Bubbly (carbonated) drinks. °¨ Tobacco. °¨ Juice. °¨ Caffeine drinks. °¨ Very hot or cold fluids. °¨ Fatty, greasy foods. °¨ Eating too much at one time. °¨ Dairy products until 24 to 48 hours after your watery poop stops. °· You may eat foods with active cultures (probiotics). They can be found in some yogurts and supplements. °· Wash your hands well to avoid spreading the illness. °· Only take medicines as told by your doctor. Do not give aspirin to children. Do not take medicines for watery poop (antidiarrheals). °· Ask your doctor if you should keep taking your regular medicines. °· Keep all doctor visits as told. °GET HELP RIGHT AWAY IF:  °· You cannot keep fluids down. °· You do not pee at least once every 6 to 8 hours. °· You are short of breath. °· You see blood in your poop or throw up. This may look like coffee grounds. °· You have belly (abdominal) pain that gets worse or is just in one small spot (localized). °· You keep throwing up or having watery poop. °· You have a fever. °· The patient is a child younger than 3 months, and he or she has a fever. °· The patient is a child older than 3 months, and he or she has a fever and problems that do not go away. °· The patient is a child older than 3 months, and he or she has a fever and problems that suddenly get worse. °· The patient is a baby, and he or she has no tears when crying. °MAKE SURE YOU:    °· Understand these instructions. °· Will watch your condition. °· Will get help right away if you are not doing well or get worse. °  °This information is not intended to replace advice given to you by your health care provider. Make sure you discuss any questions you have with your health care provider. °  °Document Released: 12/10/2007 Document Revised: 09/15/2011 Document Reviewed: 04/09/2011 °Elsevier Interactive Patient Education ©2016 [employer] ° °Food Choices to Help Relieve Diarrhea, Adult °When you have diarrhea, the foods you eat and your eating habits are very important. Choosing the right foods and drinks can help relieve diarrhea. Also, because diarrhea can last up to 7 days, you need to replace lost fluids and electrolytes (such as sodium, potassium, and chloride) in order to help prevent dehydration.  °WHAT GENERAL GUIDELINES DO I NEED TO FOLLOW? °· Slowly drink 1 cup (8 oz) of fluid for each episode of diarrhea. If you are getting enough fluid, your urine will be clear or pale yellow. °· Eat starchy foods. Some good choices include white rice, white toast, pasta, low-fiber cereal, baked potatoes (without the skin), saltine crackers, and bagels. °· Avoid large servings of any cooked vegetables. °· Limit fruit to two servings per day. A serving is ½ cup or 1 small piece. °· Choose foods with less than 2   g of fiber per serving. °· Limit fats to less than 8 tsp (38 g) per day. °· Avoid fried foods. °· Eat foods that have probiotics in them. Probiotics can be found in certain dairy products. °· Avoid foods and beverages that may increase the speed at which food moves through the stomach and intestines (gastrointestinal tract). Things to avoid include: °¨ High-fiber foods, such as dried fruit, raw fruits and vegetables, nuts, seeds, and whole grain foods. °¨ Spicy foods and high-fat foods. °¨ Foods and beverages sweetened with high-fructose corn syrup, honey, or sugar alcohols such as xylitol,  sorbitol, and mannitol. °WHAT FOODS ARE RECOMMENDED? °Grains °White rice. White, French, or pita breads (fresh or toasted), including plain rolls, buns, or bagels. White pasta. Saltine, soda, or graham crackers. Pretzels. Low-fiber cereal. Cooked cereals made with water (such as cornmeal, farina, or cream cereals). Plain muffins. Matzo. Melba toast. Zwieback.  °Vegetables °Potatoes (without the skin). Strained tomato and vegetable juices. Most well-cooked and canned vegetables without seeds. Tender lettuce. °Fruits °Cooked or canned applesauce, apricots, cherries, fruit cocktail, grapefruit, peaches, pears, or plums. Fresh bananas, apples without skin, cherries, grapes, cantaloupe, grapefruit, peaches, oranges, or plums.  °Meat and Other Protein Products °Baked or boiled chicken. Eggs. Tofu. Fish. Seafood. Smooth peanut butter. Ground or well-cooked tender beef, ham, veal, lamb, pork, or poultry.  °Dairy °Plain yogurt, kefir, and unsweetened liquid yogurt. Lactose-free milk, buttermilk, or soy milk. Plain hard cheese. °Beverages °Sport drinks. Clear broths. Diluted fruit juices (except prune). Regular, caffeine-free sodas such as ginger ale. Water. Decaffeinated teas. Oral rehydration solutions. Sugar-free beverages not sweetened with sugar alcohols. °Other °Bouillon, broth, or soups made from recommended foods.  °The items listed above may not be a complete list of recommended foods or beverages. Contact your dietitian for more options. °WHAT FOODS ARE NOT RECOMMENDED? °Grains °Whole grain, whole wheat, bran, or rye breads, rolls, pastas, crackers, and cereals. Wild or brown rice. Cereals that contain more than 2 g of fiber per serving. Corn tortillas or taco shells. Cooked or dry oatmeal. Granola. Popcorn. °Vegetables °Raw vegetables. Cabbage, broccoli, Brussels sprouts, artichokes, baked beans, beet greens, corn, kale, legumes, peas, sweet potatoes, and yams. Potato skins. Cooked spinach and  cabbage. °Fruits °Dried fruit, including raisins and dates. Raw fruits. Stewed or dried prunes. Fresh apples with skin, apricots, mangoes, pears, raspberries, and strawberries.  °Meat and Other Protein Products °Chunky peanut butter. Nuts and seeds. Beans and lentils. Bacon.  °Dairy °High-fat cheeses. Milk, chocolate milk, and beverages made with milk, such as milk shakes. Cream. Ice cream. °Sweets and Desserts °Sweet rolls, doughnuts, and sweet breads. Pancakes and waffles. °Fats and Oils °Butter. Cream sauces. Margarine. Salad oils. Plain salad dressings. Olives. Avocados.  °Beverages °Caffeinated beverages (such as coffee, tea, soda, or energy drinks). Alcoholic beverages. Fruit juices with pulp. Prune juice. Soft drinks sweetened with high-fructose corn syrup or sugar alcohols. °Other °Coconut. Hot sauce. Chili powder. Mayonnaise. Gravy. Cream-based or milk-based soups.  °The items listed above may not be a complete list of foods and beverages to avoid. Contact your dietitian for more information. °WHAT SHOULD I DO IF I BECOME DEHYDRATED? °Diarrhea can sometimes lead to dehydration. Signs of dehydration include dark urine and dry mouth and skin. If you think you are dehydrated, you should rehydrate with an oral rehydration solution. These solutions can be purchased at pharmacies, retail stores, or online.  °Drink ½-1 cup (120-240 mL) of oral rehydration solution each time you have an episode of diarrhea. If drinking this   amount makes your diarrhea worse, try drinking smaller amounts more often. For example, drink 1-3 tsp (5-15 mL) every 5-10 minutes.  °A general rule for staying hydrated is to drink 1½-2 L of fluid per day. Talk to your health care provider about the specific amount you should be drinking each day. Drink enough fluids to keep your urine clear or pale yellow. °  °This information is not intended to replace advice given to you by your health care provider. Make sure you discuss any questions you  have with your health care provider. °  °Document Released: 09/13/2003 Document Revised: 07/14/2014 Document Reviewed: 05/16/2013 °Elsevier Interactive Patient Education ©2016 Elsevier Inc. ° °

## 2015-07-25 NOTE — MAU Provider Note (Signed)
History     CSN: [id number]  Arrival date and time: 07/25/15 1046   First Provider Initiated Contact with Patient 07/25/15 1135      Chief Complaint  Patient presents with  . Abdominal Pain  . Back Pain  . Nausea   HPI Ms. Kimberly [name] is a 30 y.o. G1P0 at [redacted]w[redacted]d who presents to MAU today with complaint of recent N/V/D. The patient states that she had N/V/D starting Monday night until Tuesday morning. She states vomiting and diarrhea have resolved. She continues to have some nausea. She also complains of abdominal pain and back pain. She denies contractions, vaginal bleeding, LOF or sick contacts. She is a Runner, broadcasting/film/video. She has Phenergan and Zofran at home from earlier in the pregnancy. She took Zofran last night.   OB History    Gravida Para Term Preterm AB TAB SAB Ectopic Multiple Living   1               Past Medical History  Diagnosis Date  . Anxiety   . Palpitations   . SOB (shortness of breath)   . Chest pain   . Mononucleosis     Past Surgical History  Procedure Laterality Date  . Nasal sinus surgery    . Tonsillectomy and adenoidectomy    . Adenoidectomy      Family History  Problem Relation Age of Onset  . Asthma Mother   . Thyroid disease Father   . Depression Father   . Anxiety disorder Father   . Anxiety disorder Sister   . Other Father     B12 DEFICIENCY  . Arrhythmia Maternal Grandmother     AFIB  . Stroke Maternal Grandmother   . Cancer Paternal Grandfather     COLON  . Heart Problems Maternal Grandmother     PACER    Social History  Substance Use Topics  . Smoking status: Never Smoker   . Smokeless tobacco: Never Used  . Alcohol Use: No    Allergies:  Allergies  Allergen Reactions  . Augmentin [Amoxicillin-Pot Clavulanate]     Unknown per pt  . Sulfa Antibiotics Nausea And Vomiting  . Codeine Rash    Prescriptions prior to admission  Medication Sig Dispense Refill Last Dose  . omeprazole (PRILOSEC) 20 MG capsule Take 1  capsule (20 mg total) by mouth 2 (two) times daily before a meal. 60 capsule 5 07/25/2015 at Unknown time  . ondansetron (ZOFRAN) 8 MG tablet Take 1 tablet (8 mg total) by mouth every 8 (eight) hours as needed for nausea or vomiting. 40 tablet 2 07/24/2015 at Unknown time  . oxymetazoline (AFRIN) 0.05 % nasal spray Place 1 spray into both nostrils 2 (two) times daily. Reported on 07/11/2015   07/25/2015 at Unknown time  . ALPRAZolam (XANAX) 0.25 MG tablet Take 0.25 mg by mouth daily. Reported on 07/11/2015   Not Taking  . buPROPion (WELLBUTRIN SR) 100 MG 12 hr tablet Take 1 tablet (100 mg total) by mouth 2 (two) times daily. 60 tablet 2 07/23/2015  . Prenatal Vit-Fe Phos-FA-Omega (VITAFOL GUMMIES) 3.33-0.333-34.8 MG CHEW Chew 3 tablets by mouth daily. 90 tablet 12 07/22/2015  . promethazine (PHENERGAN) 25 MG tablet Take 1 tablet (25 mg total) by mouth every 6 (six) hours as needed for nausea or vomiting. (Patient not taking: Reported on 06/29/2015) 30 tablet 1 Not Taking    Review of Systems  Constitutional: Negative for fever and malaise/fatigue.  Gastrointestinal: Positive for nausea, vomiting, abdominal pain and  diarrhea.  Genitourinary: Negative for dysuria, urgency and frequency.       Neg - vaginal bleeding, discharge, LOF   Physical Exam   Blood pressure 104/67, pulse 103, temperature 98.3 F (36.8 C), temperature source Oral, resp. rate 18, last menstrual period 02/08/2015.  Physical Exam  Nursing note and vitals reviewed. Constitutional: She is oriented to person, place, and time. She appears well-developed and well-nourished. No distress.  HENT:  Head: Normocephalic and atraumatic.  Cardiovascular: Normal rate.   Respiratory: Effort normal.  GI: Soft. She exhibits no distension and no mass. There is no tenderness. There is no rebound and no guarding.  Neurological: She is alert and oriented to person, place, and time.  Skin: Skin is warm and dry. No erythema.  Psychiatric: She has a  normal mood and affect.    Results for orders placed or performed during the hospital encounter of 07/25/15 (from the past 24 hour(s))  Urinalysis, Routine w reflex microscopic (not at [facility])     Status: Abnormal   Collection Time: 07/25/15 11:00 AM  Result Value Ref Range   Color, Urine AMBER (A) YELLOW   APPearance CLEAR CLEAR   Specific Gravity, Urine 1.025 1.005 - 1.030   pH 6.0 5.0 - 8.0   Glucose, UA NEGATIVE NEGATIVE mg/dL   Hgb urine dipstick NEGATIVE NEGATIVE   Bilirubin Urine SMALL (A) NEGATIVE   Ketones, ur 15 (A) NEGATIVE mg/dL   Protein, ur 30 (A) NEGATIVE mg/dL   Nitrite NEGATIVE NEGATIVE   Leukocytes, UA TRACE (A) NEGATIVE  Urine microscopic-add on     Status: Abnormal   Collection Time: 07/25/15 11:00 AM  Result Value Ref Range   Squamous Epithelial / LPF 6-30 (A) NONE SEEN   WBC, UA 6-30 0 - 5 WBC/hpf   RBC / HPF 0-5 0 - 5 RBC/hpf   Bacteria, UA MANY (A) NONE SEEN   Urine-Other MUCOUS PRESENT     Fetal Monitoring: Baseline: 140 bpm Variability: moderate Accelerations: 10 x 10 Decelerations: none Contractions: none   MAU Course  Procedures None  MDM UA today IV LR with 25 mg Phenergan infusion given for continued nausea and presumed dehydration  Assessment and Plan  A: SIUP at [redacted]w[redacted]d Viral gastroenteritis  P: Discharge home Continue Zofran and/or Phenergan PRN for N/V Warning signs for worsening condition discussed Patient advised to follow-up with [facility] as scheduled for routine prenatal care or sooner PRN Patient may return to MAU as needed or if her condition were to change or worsen   Marny Lowenstein, PA-C  07/25/2015, 1:02 PM

## 2015-07-27 ENCOUNTER — Other Ambulatory Visit (HOSPITAL_COMMUNITY): Payer: Self-pay | Admitting: Obstetrics and Gynecology

## 2015-07-27 ENCOUNTER — Ambulatory Visit (HOSPITAL_COMMUNITY)
Admission: RE | Admit: 2015-07-27 | Discharge: 2015-07-27 | Disposition: A | Discharge status: Home / Self Care | Payer: BC Managed Care – PPO | Source: Ambulatory Visit | Attending: Certified Nurse Midwife | Admitting: Certified Nurse Midwife

## 2015-07-27 ENCOUNTER — Encounter (HOSPITAL_COMMUNITY): Payer: Self-pay

## 2015-07-27 DIAGNOSIS — O99342 Other mental disorders complicating pregnancy, second trimester: Secondary | ICD-10-CM

## 2015-07-27 DIAGNOSIS — O2692 Pregnancy related conditions, unspecified, second trimester: Secondary | ICD-10-CM

## 2015-07-27 DIAGNOSIS — Z0489 Encounter for examination and observation for other specified reasons: Secondary | ICD-10-CM

## 2015-07-27 DIAGNOSIS — Z3A24 24 weeks gestation of pregnancy: Secondary | ICD-10-CM

## 2015-07-27 DIAGNOSIS — Z36 Encounter for antenatal screening of mother: Secondary | ICD-10-CM | POA: Diagnosis not present

## 2015-07-27 DIAGNOSIS — F419 Anxiety disorder, unspecified: Secondary | ICD-10-CM

## 2015-07-27 DIAGNOSIS — Z6791 Unspecified blood type, Rh negative: Secondary | ICD-10-CM | POA: Insufficient documentation

## 2015-07-27 DIAGNOSIS — O360120 Maternal care for anti-D [Rh] antibodies, second trimester, not applicable or unspecified: Secondary | ICD-10-CM

## 2015-07-27 DIAGNOSIS — IMO0002 Reserved for concepts with insufficient information to code with codable children: Secondary | ICD-10-CM

## 2015-07-27 DIAGNOSIS — O26892 Other specified pregnancy related conditions, second trimester: Secondary | ICD-10-CM | POA: Diagnosis not present

## 2015-08-08 ENCOUNTER — Ambulatory Visit (INDEPENDENT_AMBULATORY_CARE_PROVIDER_SITE_OTHER): Payer: BC Managed Care – PPO | Admitting: Certified Nurse Midwife

## 2015-08-08 VITALS — BP 113/72 | HR 76 | Temp 98.6°F | Wt 160.0 lb

## 2015-08-08 DIAGNOSIS — Z3402 Encounter for supervision of normal first pregnancy, second trimester: Secondary | ICD-10-CM

## 2015-08-08 LAB — POCT URINALYSIS DIPSTICK
BILIRUBIN UA: NEGATIVE
GLUCOSE UA: NEGATIVE
KETONES UA: NEGATIVE
Leukocytes, UA: NEGATIVE
Nitrite, UA: NEGATIVE
Protein, UA: NEGATIVE
RBC UA: NEGATIVE
SPEC GRAV UA: 1.02
Urobilinogen, UA: NEGATIVE
pH, UA: 5.5

## 2015-08-08 NOTE — Progress Notes (Signed)
Subjective:    Kimberly [name] is a 30 y.o. female being seen today for her obstetrical visit. She is at [redacted]w[redacted]d gestation. Patient reports: no complaints . Fetal movement: normal.  Discussed birthing classes, breast pumps and 3D ultrasounds.  Multiple patient concerns addressed.    Problem List Items Addressed This Visit    None    Visit Diagnoses    Encounter for supervision of normal first pregnancy in second trimester    -  Primary    Relevant Orders    POCT urinalysis dipstick (Completed)      Patient Active Problem List   Diagnosis Date Noted  . Anxiety 09/16/2013  . Palpitations 09/16/2013  . Chest pain 09/16/2013   Objective:    BP 113/72 mmHg  Pulse 76  Temp(Src) 98.6 F (37 C)  Wt 160 lb (72.576 kg)  LMP 02/08/2015 FHT: 155 BPM  Uterine Size: size equals dates     Assessment:    Pregnancy @ [redacted]w[redacted]d    Doing well.   Normal pregnancy discomforts Plan:    OBGCT: discussed. Signs and symptoms of preterm labor: discussed.  Labs, problem list reviewed and updated 2 hr GTT planned Follow up in 2 weeks for OGTT and Rhogam.

## 2015-08-23 ENCOUNTER — Ambulatory Visit (INDEPENDENT_AMBULATORY_CARE_PROVIDER_SITE_OTHER): Payer: BC Managed Care – PPO | Admitting: Certified Nurse Midwife

## 2015-08-23 ENCOUNTER — Other Ambulatory Visit: Payer: BC Managed Care – PPO

## 2015-08-23 VITALS — BP 112/62 | HR 87 | Temp 98.3°F | Wt 161.0 lb

## 2015-08-23 DIAGNOSIS — O360131 Maternal care for anti-D [Rh] antibodies, third trimester, fetus 1: Secondary | ICD-10-CM | POA: Diagnosis not present

## 2015-08-23 DIAGNOSIS — Z3402 Encounter for supervision of normal first pregnancy, second trimester: Secondary | ICD-10-CM

## 2015-08-23 LAB — CBC
HCT: 29.2 % — ABNORMAL LOW (ref 36.0–46.0)
HEMOGLOBIN: 9.9 g/dL — AB (ref 12.0–15.0)
MCH: 29.9 pg (ref 26.0–34.0)
MCHC: 33.9 g/dL (ref 30.0–36.0)
MCV: 88.2 fL (ref 78.0–100.0)
MPV: 11.7 fL (ref 8.6–12.4)
Platelets: 200 10*3/uL (ref 150–400)
RBC: 3.31 MIL/uL — AB (ref 3.87–5.11)
RDW: 13.6 % (ref 11.5–15.5)
WBC: 5.8 10*3/uL (ref 4.0–10.5)

## 2015-08-23 LAB — POCT URINALYSIS DIPSTICK
Bilirubin, UA: NEGATIVE
GLUCOSE UA: NEGATIVE
Ketones, UA: NEGATIVE
Leukocytes, UA: NEGATIVE
Nitrite, UA: NEGATIVE
Protein, UA: NEGATIVE
RBC UA: NEGATIVE
UROBILINOGEN UA: NEGATIVE
pH, UA: 8

## 2015-08-23 MED ORDER — RHO D IMMUNE GLOBULIN 1500 UNIT/2ML IJ SOSY
300.0000 ug | PREFILLED_SYRINGE | Freq: Once | INTRAMUSCULAR | Status: AC
  Administered 2015-08-23: 300 ug via INTRAMUSCULAR

## 2015-08-23 NOTE — Progress Notes (Signed)
Subjective:    Kimberly [name] is a 30 y.o. female being seen today for her obstetrical visit. She is at [redacted]w[redacted]d gestation. Patient reports nausea, no bleeding, no contractions, no cramping, no leaking, vomiting and occasional dizziness. Fetal movement: normal.  Problem List Items Addressed This Visit    None    Visit Diagnoses    Encounter for supervision of normal first pregnancy in second trimester    -  Primary    Relevant Medications    rho (d) immune globulin (RHIG/RHOPHYLAC) injection 300 mcg (Completed)    Other Relevant Orders    Glucose Tolerance, 2 Hours w/1 Hour    CBC    HIV antibody    RPR    POCT urinalysis dipstick (Completed)      Patient Active Problem List   Diagnosis Date Noted  . Anxiety 09/16/2013  . Palpitations 09/16/2013  . Chest pain 09/16/2013   Objective:    BP 112/62 mmHg  Pulse 87  Temp(Src) 98.3 F (36.8 C)  Wt 161 lb (73.029 kg)  LMP 02/08/2015 FHT:  150 BPM  Uterine Size: size equals dates  Presentation: cephalic     Assessment:    Pregnancy @ [redacted]w[redacted]d weeks   RH- status Plan:     labs reviewed, problem list updated Consent signed. GBS planning TDAP offered  Rhogam given for RH negative Pediatrician: discussed. Infant feeding: plans to breastfeed. Maternity leave: discussed. Cigarette smoking: never smoked. Orders Placed This Encounter  Procedures  . Glucose Tolerance, 2 Hours w/1 Hour  . CBC  . HIV antibody  . RPR  . POCT urinalysis dipstick   Meds ordered this encounter  Medications  . rho (d) immune globulin (RHIG/RHOPHYLAC) injection 300 mcg    Sig:    Follow up in 2 Weeks.

## 2015-08-23 NOTE — Progress Notes (Signed)
Patient ID: Kimberly [name], female   DOB: Apr 15, 1986, 30 y.o.   MRN: [id number]   30 y.o.  seen at [facility] urgent care for chest pain 07/25/13  Thought she had UTI and was on some antibiotics ? Augmentin that was upsetting her stomach.  Pain was sharp and lasted minutes. 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  No coronary risk factors no meds.  Pain not exertional.  No pleuritic component.  Continues to have intermitant pains since being seen in urgent care  Also describes weekly palpitations where her heart just doesn't feel rate and pulse is too fast.  No syncope.  Denies drugs or excess caffeine or stimulants.  Walks but otherwise sedentary.  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  All other systems reviewed and negative   General: Affect appropriate Healthy:  appears stated age HEENT: normal Neck supple with no adenopathy JVP normal no bruits no thyromegaly Lungs clear with no wheezing and good diaphragmatic motion Heart:  S1/S2 no murmur,rub, gallop or click PMI normal Abdomen: benighn, BS positve, no  tenderness, no AAA no bruit.  No HSM or HJR Distal pulses intact with no bruits No edema Neuro non-focal Skin warm and dry No muscular weakness  Medications Current Outpatient Prescriptions  Medication Sig Dispense Refill  . ALPRAZolam (XANAX) 0.25 MG tablet Take 0.25 mg by mouth daily. Reported on 08/08/2015    . Iron-FA-B Cmp-C-Biot-Probiotic (FUSION PLUS) CAPS Take 1 tablet by mouth daily. 30 capsule 12  . omeprazole (PRILOSEC) 20 MG capsule Take 1 capsule (20 mg total) by mouth 2 (two) times daily before a meal. 60 capsule 5  . ondansetron (ZOFRAN) 8 MG tablet Take 1 tablet (8 mg total) by mouth every 8 (eight) hours as needed for nausea or vomiting. 40 tablet 2  . oxymetazoline (AFRIN) 0.05 % nasal spray Place 1 spray into both nostrils 2 (two) times daily. Reported on 07/11/2015    . Prenatal Vit-Fe Phos-FA-Omega (VITAFOL GUMMIES) 3.33-0.333-34.8 MG CHEW Chew 3 tablets by mouth daily. 90 tablet 12   No current facility-administered medications for this visit.    Allergies Augmentin; Sulfa antibiotics; and Codeine  Family History: Family History  Problem Relation Age of Onset  . Asthma Mother   . Thyroid disease Father   . Depression Father   . Anxiety disorder Father   . Anxiety disorder Sister   . Other Father     B12 DEFICIENCY  . Arrhythmia Maternal Grandmother     AFIB  . Stroke Maternal Grandmother   . Cancer Paternal Grandfather  COLON  . Heart Problems Maternal Grandmother     PACER    Social History: Social History   Social History  . Marital Status: Single    Spouse Name: N/A  . Number of Children: N/A  . Years of Education: N/A   Occupational History  . Not on file.   Social History Main Topics  . Smoking status: Never Smoker   . Smokeless tobacco: Never Used  . Alcohol Use: No  . Drug Use: No  . Sexual Activity: Yes    Birth Control/ Protection: None   Other Topics Concern  . Not on file   Social History Narrative     Electrocardiogram:  Urgent Care 07/25/13  normla precordial leads no limb leads seen 4/2015Sinus rate 67 normal ECG  05/11/15  SR rate 73 normal no LVH, QT 354   Assessment and Plan Chest Pain:  Atypical normal ECG  And echo observe Anxiety/Depression: chronic f/u primary xanax as needed Palpitations: benign sounding normal event monitor  Family history of HOCM:  No murmur normal ECG  Echo normal with no signs of HOCM expression MYH7 gene positive   F/U with me July post delivery   Kimberly [name]

## 2015-08-23 NOTE — Progress Notes (Signed)
Pt received Rhogam injection at today's visit.  Pt tolerated injection well.

## 2015-08-24 ENCOUNTER — Other Ambulatory Visit: Payer: Self-pay | Admitting: Certified Nurse Midwife

## 2015-08-24 DIAGNOSIS — D509 Iron deficiency anemia, unspecified: Secondary | ICD-10-CM

## 2015-08-24 DIAGNOSIS — O99013 Anemia complicating pregnancy, third trimester: Principal | ICD-10-CM

## 2015-08-24 LAB — GLUCOSE TOLERANCE, 2 HOURS W/ 1HR
GLUCOSE, 2 HOUR: 141 mg/dL — AB (ref 70–139)
Glucose, 1 hour: 113 mg/dL (ref 70–170)
Glucose, Fasting: 79 mg/dL (ref 65–99)

## 2015-08-24 LAB — RPR

## 2015-08-24 LAB — HIV ANTIBODY (ROUTINE TESTING W REFLEX): HIV: NONREACTIVE

## 2015-08-24 MED ORDER — FUSION PLUS PO CAPS: 1.0000 | ORAL_CAPSULE | Freq: Every day | ORAL | Status: DC

## 2015-08-28 ENCOUNTER — Ambulatory Visit (INDEPENDENT_AMBULATORY_CARE_PROVIDER_SITE_OTHER): Payer: BC Managed Care – PPO | Admitting: Cardiovascular Disease

## 2015-08-28 ENCOUNTER — Encounter: Payer: Self-pay | Admitting: Cardiovascular Disease

## 2015-08-28 VITALS — BP 90/60 | HR 92 | Ht 63.0 in | Wt 161.4 lb

## 2015-08-28 DIAGNOSIS — Z0000 Encounter for general adult medical examination without abnormal findings: Secondary | ICD-10-CM | POA: Diagnosis not present

## 2015-08-28 NOTE — Patient Instructions (Signed)

## 2015-08-30 ENCOUNTER — Other Ambulatory Visit: Payer: Self-pay | Admitting: *Deleted

## 2015-08-30 DIAGNOSIS — D509 Iron deficiency anemia, unspecified: Secondary | ICD-10-CM

## 2015-08-30 DIAGNOSIS — O99013 Anemia complicating pregnancy, third trimester: Principal | ICD-10-CM

## 2015-08-30 MED ORDER — FUSION PLUS PO CAPS: 1.0000 | ORAL_CAPSULE | Freq: Every day | ORAL | Status: DC

## 2015-09-05 ENCOUNTER — Ambulatory Visit (INDEPENDENT_AMBULATORY_CARE_PROVIDER_SITE_OTHER): Payer: BC Managed Care – PPO | Admitting: Certified Nurse Midwife

## 2015-09-05 VITALS — BP 112/68 | HR 88 | Temp 98.3°F | Wt 164.0 lb

## 2015-09-05 DIAGNOSIS — Z3403 Encounter for supervision of normal first pregnancy, third trimester: Secondary | ICD-10-CM

## 2015-09-05 LAB — POCT URINALYSIS DIPSTICK
Bilirubin, UA: NEGATIVE
Blood, UA: NEGATIVE
Glucose, UA: NEGATIVE
KETONES UA: NEGATIVE
NITRITE UA: NEGATIVE
PH UA: 5
PROTEIN UA: NEGATIVE
Spec Grav, UA: 1.025
UROBILINOGEN UA: NEGATIVE

## 2015-09-05 MED ORDER — VITAFOL GUMMIES 3.33-0.333-34.8 MG PO CHEW: 3.0000 | CHEWABLE_TABLET | Freq: Every day | ORAL | Status: DC

## 2015-09-05 NOTE — Progress Notes (Signed)
Subjective:    Kimberly [name] is a 30 y.o. female being seen today for her obstetrical visit. She is at [redacted]w[redacted]d gestation. Patient reports heartburn, no bleeding, no contractions, no cramping and no leaking. Fetal movement: normal.  Problem List Items Addressed This Visit    None    Visit Diagnoses    Encounter for supervision of normal first pregnancy in third trimester    -  Primary    Relevant Orders    POCT urinalysis dipstick (Completed)      Patient Active Problem List   Diagnosis Date Noted  . Anxiety 09/16/2013  . Palpitations 09/16/2013  . Chest pain 09/16/2013   Objective:    BP 112/68 mmHg  Pulse 88  Temp(Src) 98.3 F (36.8 C)  Wt 164 lb (74.39 kg)  LMP 02/08/2015 FHT:  155 BPM  Uterine Size: size equals dates  Presentation: cephalic     Assessment:    Pregnancy @ [redacted]w[redacted]d weeks   Plan:     labs reviewed, problem list updated Consent signed. GBS planning TDAP offered  Rhogam given for RH negative Pediatrician: discussed. Infant feeding: plans to breastfeed. Maternity leave: discussed. Cigarette smoking: never smoked. Orders Placed This Encounter  Procedures  . POCT urinalysis dipstick   Meds ordered this encounter  Medications  . Prenatal Vit-Fe Phos-FA-Omega (VITAFOL GUMMIES) 3.33-0.333-34.8 MG CHEW    Sig: Chew 3 tablets by mouth daily.    Dispense:  90 tablet    Refill:  12   Follow up in 2 Weeks.

## 2015-09-06 ENCOUNTER — Telehealth: Payer: Self-pay

## 2015-09-06 NOTE — Telephone Encounter (Signed)
called patient regarding her account - mailbox is full

## 2015-09-19 ENCOUNTER — Ambulatory Visit (INDEPENDENT_AMBULATORY_CARE_PROVIDER_SITE_OTHER): Payer: BC Managed Care – PPO | Admitting: Certified Nurse Midwife

## 2015-09-19 VITALS — BP 110/69 | HR 98 | Temp 98.4°F | Wt 167.0 lb

## 2015-09-19 DIAGNOSIS — Z3403 Encounter for supervision of normal first pregnancy, third trimester: Secondary | ICD-10-CM

## 2015-09-19 LAB — POCT URINALYSIS DIPSTICK
Bilirubin, UA: NEGATIVE
GLUCOSE UA: NEGATIVE
KETONES UA: 10
Nitrite, UA: NEGATIVE
RBC UA: NEGATIVE
SPEC GRAV UA: 1.025
UROBILINOGEN UA: NEGATIVE
pH, UA: 6

## 2015-09-19 NOTE — Progress Notes (Signed)
Subjective:    Kimberly [name] is a 30 y.o. female being seen today for her obstetrical visit. She is at 1625w6d gestation. Patient reports no complaints. Fetal movement: normal.  Reports taking her iron and sees a difference in her energy level when she does.    Problem List Items Addressed This Visit    None    Visit Diagnoses    Encounter for supervision of normal first pregnancy in third trimester    -  Primary    Relevant Orders    POCT urinalysis dipstick (Completed)      Patient Active Problem List   Diagnosis Date Noted  . Anxiety 09/16/2013  . Palpitations 09/16/2013  . Chest pain 09/16/2013   Objective:    BP 110/69 mmHg  Pulse 98  Temp(Src) 98.4 F (36.9 C)  Wt 167 lb (75.751 kg)  LMP 02/08/2015 FHT:  140 BPM  Uterine Size: size equals dates  Presentation: cephalic     Assessment:    Pregnancy @ 5325w6d weeks   Doing well   H/O anxiety  H/O anemia  Plan:     labs reviewed, problem list updated Consent signed. GBS planning TDAP offered  Rhogam given for RH negative Pediatrician: discussed. Infant feeding: plans to breastfeed. Maternity leave: discussed. Cigarette smoking: never smoked. Orders Placed This Encounter  Procedures  . POCT urinalysis dipstick   No orders of the defined types were placed in this encounter.   Follow up in 2 Weeks with Dr. Clearance CootsHarper to meet him.

## 2015-10-03 ENCOUNTER — Ambulatory Visit (INDEPENDENT_AMBULATORY_CARE_PROVIDER_SITE_OTHER): Payer: BC Managed Care – PPO | Admitting: Obstetrics

## 2015-10-03 VITALS — BP 111/71 | HR 102 | Temp 98.3°F | Wt 169.0 lb

## 2015-10-03 DIAGNOSIS — Z3403 Encounter for supervision of normal first pregnancy, third trimester: Secondary | ICD-10-CM

## 2015-10-03 LAB — POCT URINALYSIS DIPSTICK
Bilirubin, UA: NEGATIVE
Glucose, UA: 250
LEUKOCYTES UA: NEGATIVE
Nitrite, UA: NEGATIVE
PH UA: 6
PROTEIN UA: NEGATIVE
RBC UA: NEGATIVE
SPEC GRAV UA: 1.025
Urobilinogen, UA: NEGATIVE

## 2015-10-04 ENCOUNTER — Encounter: Payer: Self-pay | Admitting: Obstetrics

## 2015-10-04 NOTE — Progress Notes (Signed)
Subjective:    Kimberly DillsJennifer E Pals is a 30 y.o. female being seen today for her obstetrical visit. She is at 2959w0d gestation. Patient reports no complaints. Fetal movement: normal.  Problem List Items Addressed This Visit    None    Visit Diagnoses    Encounter for supervision of normal first pregnancy in third trimester    -  Primary    Relevant Orders    POCT urinalysis dipstick (Completed)      Patient Active Problem List   Diagnosis Date Noted  . Anxiety 09/16/2013  . Palpitations 09/16/2013  . Chest pain 09/16/2013   Objective:    BP 111/71 mmHg  Pulse 102  Temp(Src) 98.3 F (36.8 C)  Wt 169 lb (76.658 kg)  LMP 02/08/2015 FHT:  150 BPM  Uterine Size: size equals dates  Presentation: unsure     Assessment:    Pregnancy @ 2259w0d weeks   Plan:     labs reviewed, problem list updated Consent signed. GBS sent TDAP offered  Rhogam given for RH negative Pediatrician: discussed. Infant feeding: plans to breastfeed. Maternity leave: discussed. Cigarette smoking: never smoked. Orders Placed This Encounter  Procedures  . POCT urinalysis dipstick   No orders of the defined types were placed in this encounter.   Follow up in 1 Week.

## 2015-10-08 ENCOUNTER — Encounter (HOSPITAL_COMMUNITY): Payer: Self-pay | Admitting: *Deleted

## 2015-10-08 ENCOUNTER — Inpatient Hospital Stay (HOSPITAL_COMMUNITY)
Admission: AD | Admit: 2015-10-08 | Discharge: 2015-10-08 | Disposition: A | Discharge status: Home / Self Care | Payer: BC Managed Care – PPO | Source: Ambulatory Visit | Attending: Obstetrics | Admitting: Obstetrics

## 2015-10-08 DIAGNOSIS — R0602 Shortness of breath: Secondary | ICD-10-CM | POA: Insufficient documentation

## 2015-10-08 DIAGNOSIS — Z3A34 34 weeks gestation of pregnancy: Secondary | ICD-10-CM | POA: Insufficient documentation

## 2015-10-08 DIAGNOSIS — N390 Urinary tract infection, site not specified: Secondary | ICD-10-CM | POA: Diagnosis not present

## 2015-10-08 DIAGNOSIS — O9989 Other specified diseases and conditions complicating pregnancy, childbirth and the puerperium: Secondary | ICD-10-CM | POA: Diagnosis not present

## 2015-10-08 DIAGNOSIS — Z880 Allergy status to penicillin: Secondary | ICD-10-CM | POA: Insufficient documentation

## 2015-10-08 DIAGNOSIS — Z882 Allergy status to sulfonamides status: Secondary | ICD-10-CM | POA: Insufficient documentation

## 2015-10-08 DIAGNOSIS — O26899 Other specified pregnancy related conditions, unspecified trimester: Secondary | ICD-10-CM

## 2015-10-08 DIAGNOSIS — Z818 Family history of other mental and behavioral disorders: Secondary | ICD-10-CM | POA: Diagnosis not present

## 2015-10-08 DIAGNOSIS — F419 Anxiety disorder, unspecified: Secondary | ICD-10-CM | POA: Insufficient documentation

## 2015-10-08 DIAGNOSIS — Z823 Family history of stroke: Secondary | ICD-10-CM | POA: Insufficient documentation

## 2015-10-08 DIAGNOSIS — R102 Pelvic and perineal pain: Secondary | ICD-10-CM | POA: Diagnosis not present

## 2015-10-08 DIAGNOSIS — Z9889 Other specified postprocedural states: Secondary | ICD-10-CM | POA: Insufficient documentation

## 2015-10-08 DIAGNOSIS — O26893 Other specified pregnancy related conditions, third trimester: Secondary | ICD-10-CM | POA: Insufficient documentation

## 2015-10-08 DIAGNOSIS — N949 Unspecified condition associated with female genital organs and menstrual cycle: Secondary | ICD-10-CM | POA: Diagnosis not present

## 2015-10-08 DIAGNOSIS — Z885 Allergy status to narcotic agent status: Secondary | ICD-10-CM | POA: Diagnosis not present

## 2015-10-08 DIAGNOSIS — R002 Palpitations: Secondary | ICD-10-CM | POA: Diagnosis not present

## 2015-10-08 DIAGNOSIS — Z8249 Family history of ischemic heart disease and other diseases of the circulatory system: Secondary | ICD-10-CM | POA: Diagnosis not present

## 2015-10-08 DIAGNOSIS — Z808 Family history of malignant neoplasm of other organs or systems: Secondary | ICD-10-CM | POA: Diagnosis not present

## 2015-10-08 LAB — URINALYSIS, ROUTINE W REFLEX MICROSCOPIC
BILIRUBIN URINE: NEGATIVE
GLUCOSE, UA: NEGATIVE mg/dL
HGB URINE DIPSTICK: NEGATIVE
KETONES UR: NEGATIVE mg/dL
Nitrite: NEGATIVE
PROTEIN: NEGATIVE mg/dL
Specific Gravity, Urine: 1.015 (ref 1.005–1.030)
pH: 5.5 (ref 5.0–8.0)

## 2015-10-08 LAB — URINE MICROSCOPIC-ADD ON

## 2015-10-08 MED ORDER — PHENAZOPYRIDINE HCL 200 MG PO TABS
200.0000 mg | ORAL_TABLET | Freq: Three times a day (TID) | ORAL | Status: DC
Start: 2015-10-08 — End: 2015-10-25

## 2015-10-08 NOTE — MAU Provider Note (Signed)
History     CSN: [id number]  Arrival date and time: 10/08/15 1045   First Provider Initiated Contact with Patient 10/08/15 1201        Chief Complaint  Patient presents with  . Pelvic Pain   HPI  Kimberly [name] is a 30 y.o. G1P0 at 5169w4d who presents for pelvic pain & pressure. Reports symptoms since Saturday. Stated lower abdominal & pelvic pain that is worse with standing & movement. Currently being treated for UTI since yesterday. Reports suprapubic "spasms" when she voids. Denies vaginal bleeding, LOF, fever, n/v, flank pain. Positive fetal movement.   OB History    Gravida Para Term Preterm AB TAB SAB Ectopic Multiple Living   1         0      Past Medical History  Diagnosis Date  . Anxiety   . Palpitations   . SOB (shortness of breath)   . Chest pain   . Mononucleosis     Past Surgical History  Procedure Laterality Date  . Nasal sinus surgery    . Tonsillectomy and adenoidectomy    . Adenoidectomy      Family History  Problem Relation Age of Onset  . Asthma Mother   . Thyroid disease Father   . Depression Father   . Anxiety disorder Father   . Anxiety disorder Sister   . Other Father     B12 DEFICIENCY  . Arrhythmia Maternal Grandmother     AFIB  . Stroke Maternal Grandmother   . Cancer Paternal Grandfather     COLON  . Heart Problems Maternal Grandmother     PACER    Social History  Substance Use Topics  . Smoking status: Never Smoker   . Smokeless tobacco: Never Used  . Alcohol Use: No    Allergies:  Allergies  Allergen Reactions  . Augmentin [Amoxicillin-Pot Clavulanate]     Unknown per pt  . Sulfa Antibiotics Nausea And Vomiting  . Codeine Rash    Prescriptions prior to admission  Medication Sig Dispense Refill Last Dose  . ALPRAZolam (XANAX) 0.25 MG tablet Take 0.25 mg by mouth daily. Reported on 09/19/2015   Not Taking  . buPROPion (WELLBUTRIN SR) 100 MG 12 hr tablet      . Iron-FA-B Cmp-C-Biot-Probiotic (FUSION PLUS) CAPS Take  1 tablet by mouth daily. 30 capsule 12 Taking  . nitrofurantoin, macrocrystal-monohydrate, (MACROBID) 100 MG capsule      . omeprazole (PRILOSEC) 20 MG capsule Take 1 capsule (20 mg total) by mouth 2 (two) times daily before a meal. 60 capsule 5 Taking  . ondansetron (ZOFRAN) 8 MG tablet Take 1 tablet (8 mg total) by mouth every 8 (eight) hours as needed for nausea or vomiting. (Patient not taking: Reported on 09/05/2015) 40 tablet 2 Not Taking  . oxymetazoline (AFRIN) 0.05 % nasal spray Place 1 spray into both nostrils 2 (two) times daily. Reported on 07/11/2015   Taking  . Prenatal Vit-Fe Phos-FA-Omega (VITAFOL GUMMIES) 3.33-0.333-34.8 MG CHEW Chew 3 tablets by mouth daily. 90 tablet 12 Taking    Review of Systems  Constitutional: Negative.   Gastrointestinal: Positive for abdominal pain. Negative for nausea, vomiting, diarrhea and constipation.  Genitourinary: Positive for dysuria. Negative for hematuria and flank pain.   Physical Exam   Blood pressure 116/71, pulse 88, temperature 98.2 F (36.8 C), temperature source Oral, resp. rate 18, height 5\' 4"  (1.626 m), weight 170 lb 12.8 oz (77.474 kg), last menstrual period 02/08/2015.  Physical Exam  Nursing note and vitals reviewed. Constitutional: She is oriented to person, place, and time. She appears well-developed and well-nourished. No distress.  HENT:  Head: Normocephalic and atraumatic.  Eyes: Conjunctivae are normal. Right eye exhibits no discharge. Left eye exhibits no discharge. No scleral icterus.  Neck: Normal range of motion.  Cardiovascular: Normal rate.   Respiratory: Effort normal. No respiratory distress.  GI: Soft. There is no tenderness. There is no rebound and no guarding.  Neurological: She is alert and oriented to person, place, and time.  Skin: Skin is warm and dry. She is not diaphoretic.  Psychiatric: She has a normal mood and affect. Her behavior is normal. Judgment and thought content normal.   Dilation:  Fingertip Effacement (%): 50 Cervical Position: Posterior Presentation: Vertex Exam by:: Judeth Horn  Fetal Tracing:  Baseline: 140 Variability: moderate Accelerations: 15x15 Decelerations: none  Toco: none    MAU Course  Procedures Results for orders placed or performed during the hospital encounter of 10/08/15 (from the past 24 hour(s))  Urinalysis, Routine w reflex microscopic (not at [facility])     Status: Abnormal   Collection Time: 10/08/15 11:10 AM  Result Value Ref Range   Color, Urine YELLOW YELLOW   APPearance CLEAR CLEAR   Specific Gravity, Urine 1.015 1.005 - 1.030   pH 5.5 5.0 - 8.0   Glucose, UA NEGATIVE NEGATIVE mg/dL   Hgb urine dipstick NEGATIVE NEGATIVE   Bilirubin Urine NEGATIVE NEGATIVE   Ketones, ur NEGATIVE NEGATIVE mg/dL   Protein, ur NEGATIVE NEGATIVE mg/dL   Nitrite NEGATIVE NEGATIVE   Leukocytes, UA SMALL (A) NEGATIVE  Urine microscopic-add on     Status: Abnormal   Collection Time: 10/08/15 11:10 AM  Result Value Ref Range   Squamous Epithelial / LPF 0-5 (A) NONE SEEN   WBC, UA 0-5 0 - 5 WBC/hpf   RBC / HPF 0-5 0 - 5 RBC/hpf   Bacteria, UA FEW (A) NONE SEEN    MDM Cervix fingertip & posterior Reactive tracing & no contractions Discussed use of maternity support belt; states she has one at home but doesn't wear it Will rx pyridium for bladder spasms & send today's urine for culture  Assessment and Plan  A: 1. Pain of round ligament affecting pregnancy, antepartum     P: Discharge home Rx pyridium Urine culture pending Use maternity support belt Increase water intake Slow position changes Keep f/u in office  Judeth Horn 10/08/2015, 11:43 AM

## 2015-10-08 NOTE — Discharge Instructions (Signed)
Round Ligament Pain  The round ligament is a cord of muscle and tissue that helps to support the uterus. It can become a source of pain during pregnancy if it becomes stretched or twisted as the baby grows. The pain usually begins in the second trimester of pregnancy, and it can come and go until the baby is delivered. It is not a serious problem, and it does not cause harm to the baby.  Round ligament pain is usually a short, sharp, and pinching pain, but it can also be a dull, lingering, and aching pain. The pain is felt in the lower side of the abdomen or in the groin. It usually starts deep in the groin and moves up to the outside of the hip area. Pain can occur with:   A sudden change in position.   Rolling over in bed.   Coughing or sneezing.   Physical activity.  HOME CARE INSTRUCTIONS  Watch your condition for any changes. Take these steps to help with your pain:   When the pain starts, relax. Then try:    Sitting down.    Flexing your knees up to your abdomen.    Lying on your side with one pillow under your abdomen and another pillow between your legs.    Sitting in a warm bath for 15-20 minutes or until the pain goes away.   Take over-the-counter and prescription medicines only as told by your health care provider.   Move slowly when you sit and stand.   Avoid long walks if they cause pain.   Stop or lessen your physical activities if they cause pain.  SEEK MEDICAL CARE IF:   Your pain does not go away with treatment.   You feel pain in your back that you did not have before.   Your medicine is not helping.  SEEK IMMEDIATE MEDICAL CARE IF:   You develop a fever or chills.   You develop uterine contractions.   You develop vaginal bleeding.   You develop nausea or vomiting.   You develop diarrhea.   You have pain when you urinate.     This information is not intended to replace advice given to you by your health care provider. Make sure you discuss any questions you have with your health  care provider.     Document Released: 04/01/2008 Document Revised: 09/15/2011 Document Reviewed: 08/30/2014  Elsevier Interactive Patient Education 2016 [employer] Inc[employer]

## 2015-10-08 NOTE — MAU Note (Signed)
Thinks might have a UTI. Over the weekend was feeling a lot of suprapubic pressure, some burning. Was worse when she stood up.  Called office, a prescription was called in, has taken 2 dose, now it seems to be off and on.   Had frequency and urgency, some ? Discomfort.

## 2015-10-09 LAB — CULTURE, OB URINE: CULTURE: NO GROWTH

## 2015-10-10 ENCOUNTER — Encounter: Payer: BC Managed Care – PPO | Admitting: Certified Nurse Midwife

## 2015-10-16 ENCOUNTER — Ambulatory Visit (INDEPENDENT_AMBULATORY_CARE_PROVIDER_SITE_OTHER): Payer: BC Managed Care – PPO | Admitting: Certified Nurse Midwife

## 2015-10-16 VITALS — BP 122/75 | HR 91 | Wt 171.0 lb

## 2015-10-16 DIAGNOSIS — Z3403 Encounter for supervision of normal first pregnancy, third trimester: Secondary | ICD-10-CM

## 2015-10-16 DIAGNOSIS — J069 Acute upper respiratory infection, unspecified: Secondary | ICD-10-CM

## 2015-10-16 LAB — POCT URINALYSIS DIPSTICK
BILIRUBIN UA: NEGATIVE
GLUCOSE UA: NEGATIVE
Nitrite, UA: NEGATIVE
Protein, UA: NEGATIVE
RBC UA: NEGATIVE
SPEC GRAV UA: 1.02
Urobilinogen, UA: NEGATIVE
pH, UA: 5.5

## 2015-10-16 MED ORDER — PSEUDOEPH-BROMPHEN-DM 30-2-10 MG/5ML PO SYRP: 5.0000 mL | ORAL_SOLUTION | Freq: Four times a day (QID) | ORAL | Status: DC | PRN

## 2015-10-16 MED ORDER — DEXTROMETHORPHAN-GUAIFENESIN 15-400 MG PO TABS: 2.0000 | ORAL_TABLET | Freq: Four times a day (QID) | ORAL | Status: DC | PRN

## 2015-10-16 MED ORDER — ALBUTEROL SULFATE HFA 108 (90 BASE) MCG/ACT IN AERS: 1.0000 | INHALATION_SPRAY | Freq: Four times a day (QID) | RESPIRATORY_TRACT | Status: DC | PRN

## 2015-10-16 MED ORDER — BENZONATATE 100 MG PO CAPS: 100.0000 mg | ORAL_CAPSULE | Freq: Three times a day (TID) | ORAL | Status: DC | PRN

## 2015-10-16 MED ORDER — FLUTICASONE PROPIONATE 50 MCG/ACT NA SUSP: 2.0000 | Freq: Every day | NASAL | Status: DC

## 2015-10-16 NOTE — Progress Notes (Signed)
Pt would like to discuss antibiotics she was given for sinus cold.

## 2015-10-16 NOTE — Progress Notes (Signed)
Subjective:    Kimberly [name] is a 30 y.o. female being seen today for her obstetrical visit. She is at 868w5d gestation. Patient reports no bleeding, no cramping, no leaking, occasional contractions and URI for about a week that is not improving, denies any fever, is coughing up sputum, does have sinus pressure/pain and reports post nasal drip. Fetal movement: normal.  Problem List Items Addressed This Visit    None    Visit Diagnoses    Encounter for supervision of normal first pregnancy in third trimester    -  Primary    Relevant Orders    POCT urinalysis dipstick (Completed)    URI, acute        Relevant Medications    cephALEXin (KEFLEX) 250 MG capsule    benzonatate (TESSALON) 100 MG capsule    albuterol (PROVENTIL HFA;VENTOLIN HFA) 108 (90 Base) MCG/ACT inhaler    Dextromethorphan-Guaifenesin 15-400 MG TABS    brompheniramine-pseudoephedrine-DM 30-2-10 MG/5ML syrup    fluticasone (FLONASE) 50 MCG/ACT nasal spray      Patient Active Problem List   Diagnosis Date Noted  . Anxiety 09/16/2013  . Palpitations 09/16/2013  . Chest pain 09/16/2013   Objective:    BP 122/75 mmHg  Pulse 91  Wt 171 lb (77.565 kg)  LMP 02/08/2015 FHT:  150 BPM  Uterine Size: size equals dates  Presentation: cephalic     Assessment:    Pregnancy @ 488w5d weeks   URI  Plan:     labs reviewed, problem list updated Consent signed. GBS planning TDAP offered  Rhogam given for RH negative Pediatrician: discussed. Infant feeding: plans to breastfeed. Maternity leave: discussed. Cigarette smoking: never smoked. Orders Placed This Encounter  Procedures  . POCT urinalysis dipstick   Meds ordered this encounter  Medications  . cephALEXin (KEFLEX) 250 MG capsule    Sig: Take by mouth 4 (four) times daily.  . benzonatate (TESSALON) 100 MG capsule    Sig: Take 1 capsule (100 mg total) by mouth 3 (three) times daily as needed for cough.    Dispense:  60 capsule    Refill:  0  . albuterol  (PROVENTIL HFA;VENTOLIN HFA) 108 (90 Base) MCG/ACT inhaler    Sig: Inhale 1-2 puffs into the lungs every 6 (six) hours as needed for wheezing or shortness of breath.    Dispense:  18 g    Refill:  0  . Dextromethorphan-Guaifenesin 15-400 MG TABS    Sig: Take 2 tablets by mouth every 6 (six) hours as needed.    Dispense:  84 each    Refill:  0  . brompheniramine-pseudoephedrine-DM 30-2-10 MG/5ML syrup    Sig: Take 5 mLs by mouth 4 (four) times daily as needed.    Dispense:  120 mL    Refill:  0  . fluticasone (FLONASE) 50 MCG/ACT nasal spray    Sig: Place 2 sprays into both nostrils daily.    Dispense:  16 g    Refill:  2   Follow up in 1 Week with GBS.

## 2015-10-17 ENCOUNTER — Encounter: Payer: BC Managed Care – PPO | Admitting: Certified Nurse Midwife

## 2015-10-18 ENCOUNTER — Telehealth: Payer: Self-pay | Admitting: *Deleted

## 2015-10-18 NOTE — Telephone Encounter (Signed)
Patient states she was walking her dog and he pulled her and she fell on her hands and knees last night. She just wanted to check and make sure that she was OK.  12:08 Call to patient- she did not hit her stomach, no cramping, no change in discharge, baby is moving normally.Advised patient it sounds like she is fine- she has not had any changes- advised her if she should have cramping or any change in discharge to go to MAU. She has an appointment next week here in the office.

## 2015-10-24 ENCOUNTER — Ambulatory Visit (INDEPENDENT_AMBULATORY_CARE_PROVIDER_SITE_OTHER): Payer: BC Managed Care – PPO | Admitting: Certified Nurse Midwife

## 2015-10-24 VITALS — BP 130/92 | HR 88 | Wt 174.0 lb

## 2015-10-24 DIAGNOSIS — Z3403 Encounter for supervision of normal first pregnancy, third trimester: Secondary | ICD-10-CM

## 2015-10-24 LAB — POCT URINALYSIS DIPSTICK
Bilirubin, UA: NEGATIVE
Blood, UA: NEGATIVE
Glucose, UA: NEGATIVE
NITRITE UA: NEGATIVE
PH UA: 5
PROTEIN UA: NEGATIVE
Spec Grav, UA: 1.025
UROBILINOGEN UA: NEGATIVE

## 2015-10-24 NOTE — Progress Notes (Signed)
Subjective:    Kimberly DillsJennifer E [name] is a 30 y.o. female being seen today for her obstetrical visit. She is at 9646w6d gestation. Patient reports no complaints. Fetal movement: normal.  Problem List Items Addressed This Visit    None    Visit Diagnoses    Encounter for supervision of normal first pregnancy in third trimester    -  Primary    Relevant Orders    POCT urinalysis dipstick (Completed)    Strep Gp B NAA      Patient Active Problem List   Diagnosis Date Noted  . Anxiety 09/16/2013  . Palpitations 09/16/2013  . Chest pain 09/16/2013   Objective:    BP 130/92 mmHg  Pulse 88  Wt 174 lb (78.926 kg)  LMP 02/08/2015 FHT:  145 BPM  Uterine Size: size equals dates  Presentation: cephalic   Cervix:  Long, thick, closed and posterior.  Stool felt in rectum, discussed with patient.    Assessment:    Pregnancy @ 6646w6d weeks   Doing well  Plan:     labs reviewed, problem list updated Consent signed. GBS sent TDAP offered  Rhogam given for RH negative Pediatrician: discussed. Infant feeding: plans to breastfeed. Maternity leave: discussed, forms done. Cigarette smoking: never smoked. Orders Placed This Encounter  Procedures  . Strep Gp B NAA  . POCT urinalysis dipstick   No orders of the defined types were placed in this encounter.   Follow up in 1 Week.

## 2015-10-25 ENCOUNTER — Ambulatory Visit (INDEPENDENT_AMBULATORY_CARE_PROVIDER_SITE_OTHER): Payer: BC Managed Care – PPO | Admitting: Certified Nurse Midwife

## 2015-10-25 ENCOUNTER — Telehealth: Payer: Self-pay | Admitting: *Deleted

## 2015-10-25 ENCOUNTER — Inpatient Hospital Stay (HOSPITAL_COMMUNITY)
Admission: AD | Admit: 2015-10-25 | Discharge: 2015-10-25 | Disposition: A | Discharge status: Home / Self Care | Payer: BC Managed Care – PPO | Source: Ambulatory Visit | Attending: Obstetrics | Admitting: Obstetrics

## 2015-10-25 ENCOUNTER — Encounter (HOSPITAL_COMMUNITY): Payer: Self-pay

## 2015-10-25 VITALS — BP 129/80 | HR 111 | Wt 169.0 lb

## 2015-10-25 DIAGNOSIS — O99613 Diseases of the digestive system complicating pregnancy, third trimester: Secondary | ICD-10-CM | POA: Diagnosis not present

## 2015-10-25 DIAGNOSIS — Z3A37 37 weeks gestation of pregnancy: Secondary | ICD-10-CM | POA: Insufficient documentation

## 2015-10-25 DIAGNOSIS — R112 Nausea with vomiting, unspecified: Secondary | ICD-10-CM | POA: Diagnosis present

## 2015-10-25 DIAGNOSIS — Z880 Allergy status to penicillin: Secondary | ICD-10-CM | POA: Diagnosis not present

## 2015-10-25 DIAGNOSIS — O26893 Other specified pregnancy related conditions, third trimester: Secondary | ICD-10-CM

## 2015-10-25 DIAGNOSIS — K529 Noninfective gastroenteritis and colitis, unspecified: Secondary | ICD-10-CM | POA: Insufficient documentation

## 2015-10-25 DIAGNOSIS — F419 Anxiety disorder, unspecified: Secondary | ICD-10-CM | POA: Insufficient documentation

## 2015-10-25 DIAGNOSIS — Z885 Allergy status to narcotic agent status: Secondary | ICD-10-CM | POA: Insufficient documentation

## 2015-10-25 DIAGNOSIS — Z882 Allergy status to sulfonamides status: Secondary | ICD-10-CM | POA: Insufficient documentation

## 2015-10-25 DIAGNOSIS — Z3403 Encounter for supervision of normal first pregnancy, third trimester: Secondary | ICD-10-CM

## 2015-10-25 LAB — URINALYSIS, ROUTINE W REFLEX MICROSCOPIC
GLUCOSE, UA: 500 mg/dL — AB
HGB URINE DIPSTICK: NEGATIVE
Ketones, ur: 40 mg/dL — AB
Leukocytes, UA: NEGATIVE
Nitrite: NEGATIVE
Protein, ur: NEGATIVE mg/dL
pH: 5.5 (ref 5.0–8.0)

## 2015-10-25 LAB — OB RESULTS CONSOLE GBS: GBS: NEGATIVE

## 2015-10-25 MED ORDER — SODIUM CHLORIDE 0.9 % IV SOLN
8.0000 mg | Freq: Once | INTRAVENOUS | Status: AC
  Administered 2015-10-25: 8 mg via INTRAVENOUS
  Filled 2015-10-25: qty 4

## 2015-10-25 MED ORDER — PROMETHAZINE HCL 12.5 MG PO TABS: 12.5000 mg | ORAL_TABLET | Freq: Four times a day (QID) | ORAL | Status: DC | PRN

## 2015-10-25 MED ORDER — LACTATED RINGERS IV BOLUS (SEPSIS)
1000.0000 mL | Freq: Once | INTRAVENOUS | Status: AC
  Administered 2015-10-25: 1000 mL via INTRAVENOUS

## 2015-10-25 MED ORDER — ONDANSETRON 4 MG PO TBDP: 4.0000 mg | ORAL_TABLET | Freq: Three times a day (TID) | ORAL | Status: DC | PRN

## 2015-10-25 MED ORDER — DEXTROSE 5 % IN LACTATED RINGERS IV BOLUS
1000.0000 mL | Freq: Once | INTRAVENOUS | Status: AC
  Administered 2015-10-25: 1000 mL via INTRAVENOUS

## 2015-10-25 NOTE — Progress Notes (Signed)
Pt has had vomitting and diarrhea since last night.  Pt complaints of cramping as well.

## 2015-10-25 NOTE — MAU Provider Note (Signed)
History     CSN: [id number]  Arrival date and time: 10/25/15 1435   First Provider Initiated Contact with Patient 10/25/15 1615      Chief Complaint  Patient presents with  . Emesis  . Diarrhea   HPI   Ms.Kimberly [name] is a 30 y.o. female G1P0 @ [redacted]w[redacted]d presenting to MAU with N/V/D. She attests to 6 episodes of diarrhea and 6 episodes of vomiting. Symptoms started at 0100 and lasted about 6 hours.  Last vomiting and diarrhea was 6:30 am. Unable to eat today, she has been sipping on sprite.   She is not feeling contractions at this time, she is having some lower back pain.  She was checked in the office today and dilated to 1 cm.   OB History    Gravida Para Term Preterm AB TAB SAB Ectopic Multiple Living   1         0      Past Medical History  Diagnosis Date  . Anxiety   . Palpitations   . SOB (shortness of breath)   . Chest pain   . Mononucleosis     Past Surgical History  Procedure Laterality Date  . Nasal sinus surgery    . Tonsillectomy and adenoidectomy    . Adenoidectomy      Family History  Problem Relation Age of Onset  . Asthma Mother   . Thyroid disease Father   . Depression Father   . Anxiety disorder Father   . Anxiety disorder Sister   . Other Father     B12 DEFICIENCY  . Arrhythmia Maternal Grandmother     AFIB  . Stroke Maternal Grandmother   . Cancer Paternal Grandfather     COLON  . Heart Problems Maternal Grandmother     PACER    Social History  Substance Use Topics  . Smoking status: Never Smoker   . Smokeless tobacco: Never Used  . Alcohol Use: No    Allergies:  Allergies  Allergen Reactions  . Augmentin [Amoxicillin-Pot Clavulanate] Nausea And Vomiting  . Sulfa Antibiotics Nausea And Vomiting  . Codeine Nausea And Vomiting    Prescriptions prior to admission  Medication Sig Dispense Refill Last Dose  . Acetaminophen (TYLENOL PO) Take 2 tablets by mouth daily as needed (for headaches/pain).   Past Week at Unknown  time  . albuterol (PROVENTIL HFA;VENTOLIN HFA) 108 (90 Base) MCG/ACT inhaler Inhale 1-2 puffs into the lungs every 6 (six) hours as needed for wheezing or shortness of breath. 18 g 0   . benzonatate (TESSALON) 100 MG capsule Take 1 capsule (100 mg total) by mouth 3 (three) times daily as needed for cough. 60 capsule 0 Taking  . brompheniramine-pseudoephedrine-DM 30-2-10 MG/5ML syrup Take 5 mLs by mouth 4 (four) times daily as needed. (Patient not taking: Reported on 10/24/2015) 120 mL 0 Not Taking  . Dextromethorphan-Guaifenesin 15-400 MG TABS Take 2 tablets by mouth every 6 (six) hours as needed. (Patient not taking: Reported on 10/24/2015) 84 each 0 Not Taking  . fluticasone (FLONASE) 50 MCG/ACT nasal spray Place 2 sprays into both nostrils daily. (Patient not taking: Reported on 10/24/2015) 16 g 2 Not Taking  . Iron-FA-B Cmp-C-Biot-Probiotic (FUSION PLUS) CAPS Take 1 tablet by mouth daily. 30 capsule 12 Taking  . omeprazole (PRILOSEC) 20 MG capsule Take 1 capsule (20 mg total) by mouth 2 (two) times daily before a meal. 60 capsule 5 Taking  . ondansetron (ZOFRAN) 8 MG tablet Take 1 tablet (8  mg total) by mouth every 8 (eight) hours as needed for nausea or vomiting. (Patient not taking: Reported on 09/05/2015) 40 tablet 2 Not Taking  . oxymetazoline (AFRIN) 0.05 % nasal spray Place 1 spray into both nostrils 2 (two) times daily. Reported on 10/24/2015   Taking  . phenazopyridine (PYRIDIUM) 200 MG tablet Take 1 tablet (200 mg total) by mouth 3 (three) times daily. (Patient not taking: Reported on 10/24/2015) 6 tablet 0 Not Taking  . Prenatal Vit-Fe Phos-FA-Omega (VITAFOL GUMMIES) 3.33-0.333-34.8 MG CHEW Chew 3 tablets by mouth daily. 90 tablet 12 Taking   Results for orders placed or performed during the hospital encounter of 10/25/15 (from the past 48 hour(s))  Urinalysis, Routine w reflex microscopic (not [facility])     Status: Abnormal   Collection Time: 10/25/15  4:29 PM  Result Value Ref Range   Color,  Urine YELLOW YELLOW   APPearance HAZY (A) CLEAR   Specific Gravity, Urine >1.030 (H) 1.005 - 1.030   pH 5.5 5.0 - 8.0   Glucose, UA 500 (A) NEGATIVE mg/dL   Hgb urine dipstick NEGATIVE NEGATIVE   Bilirubin Urine SMALL (A) NEGATIVE   Ketones, ur 40 (A) NEGATIVE mg/dL   Protein, ur NEGATIVE NEGATIVE mg/dL   Nitrite NEGATIVE NEGATIVE   Leukocytes, UA NEGATIVE NEGATIVE    Comment: MICROSCOPIC NOT DONE ON URINES WITH NEGATIVE PROTEIN, BLOOD, LEUKOCYTES, NITRITE, OR GLUCOSE <1000 mg/dL.    Review of Systems  Constitutional: Negative for fever and chills.  Gastrointestinal: Positive for nausea, vomiting and diarrhea. Negative for heartburn and abdominal pain.  Genitourinary: Positive for dysuria.  Musculoskeletal: Positive for back pain.   Physical Exam   Blood pressure 124/56, pulse 126, temperature 99.8 F (37.7 C), temperature source Oral, resp. rate 20, height 5\' 4"  (1.626 m), weight 169 lb (76.658 kg), last menstrual period 02/08/2015, SpO2 99 %.  Physical Exam  Constitutional: She is oriented to person, place, and time. She appears well-developed and well-nourished. No distress.  HENT:  Head: Normocephalic.  Eyes: Pupils are equal, round, and reactive to light.  Musculoskeletal: Normal range of motion.  Neurological: She is alert and oriented to person, place, and time.  Skin: Skin is warm. She is not diaphoretic.  Psychiatric: Her behavior is normal.   Fetal Tracing: Baseline: 150 bpm  Variability: Moderate  Accelerations: 15x15 Decelerations: None Toco: Irregular pattern, patient not having abdominal pain.   MAU Course  Procedures  None  MDM  D5LR bolus LR bolus  Zofran 8 mg IV Po challenge: fluids pass. No vomiting in MAU.   Assessment and Plan   A:  1. Gastroenteritis, acute     P:  Discharge home in stable condition RX: Zofran, Phenergan BRAT diet Follow up with Dr. Clearance CootsHarper as scheduled Return to MAU if symptoms worsen  Duane LopeJennifer I Rasch, NP   10/25/2015 7:04 PM

## 2015-10-25 NOTE — MAU Note (Signed)
Pt sent from MD office due to nausea/vomiting/diarrhea since last night. Pt lost 5 pounds since yesterday. Pt states baby is moving normally. Pt denies bleeding and leaking of fluid.

## 2015-10-25 NOTE — Telephone Encounter (Signed)
Patient states she had elevated BP at her appointment yesterday- she went home and has had n/v, diarrhea  accompanied by cramping. After consulting with Boykin Reaperachelle- we have called the patient to come to the office this afternoon. LM on VM.

## 2015-10-25 NOTE — Discharge Instructions (Signed)
Norovirus Infection °A norovirus infection is caused by exposure to a virus in a group of similar viruses (noroviruses). This type of infection causes inflammation in your stomach and intestines (gastroenteritis). Norovirus is the most common cause of gastroenteritis. It also causes food poisoning. °Anyone can get a norovirus infection. It spreads very easily (contagious). You can get it from contaminated food, water, surfaces, or other people. Norovirus is found in the stool or vomit of infected people. You can spread the infection as soon as you feel sick until 2 weeks after you recover.  °Symptoms usually begin within 2 days after you become infected. Most norovirus symptoms affect the digestive system. °CAUSES °Norovirus infection is caused by contact with norovirus. You can catch norovirus if you: °· Eat or drink something contaminated with norovirus. °· Touch surfaces or objects contaminated with norovirus and then put your hand in your mouth. °· Have direct contact with an infected person who has symptoms. °· Share food, drink, or utensils with someone with who is sick with norovirus. °SIGNS AND SYMPTOMS °Symptoms of norovirus may include: °· Nausea. °· Vomiting. °· Diarrhea. °· Stomach cramps. °· Fever. °· Chills. °· Headache. °· Muscle aches. °· Tiredness. °DIAGNOSIS °Your health care provider may suspect norovirus based on your symptoms and physical exam. Your health care provider may also test a sample of your stool or vomit for the virus.  °TREATMENT °There is no specific treatment for norovirus. Most people get better without treatment in about 2 days. °HOME CARE INSTRUCTIONS °· Replace lost fluids by drinking plenty of water or rehydration fluids containing important minerals called electrolytes. This prevents dehydration. Drink enough fluid to keep your urine clear or pale yellow. °· Do not prepare food for others while you are infected. Wait at least 3 days after recovering from the illness to do  that. °PREVENTION  °· Wash your hands often, especially after using the toilet or changing a diaper. °· Wash fruits and vegetables thoroughly before preparing or serving them. °· Throw out any food that a sick person may have touched. °· Disinfect contaminated surfaces immediately after someone in the household has been sick. Use a bleach-based household cleaner. °· Immediately remove and wash soiled clothes or sheets. °SEEK MEDICAL CARE IF: °· Your vomiting, diarrhea, and stomach pain is getting worse. °· Your symptoms of norovirus do not go away after 2-3 days. °SEEK IMMEDIATE MEDICAL CARE IF:  °You develop symptoms of dehydration that do not improve with fluid replacement. This may include: °· Excessive sleepiness. °· Lack of tears. °· Dry mouth. °· Dizziness when standing. °· Weak pulse. °  °This information is not intended to replace advice given to you by your health care provider. Make sure you discuss any questions you have with your health care provider. °  °Document Released: 09/13/2002 Document Revised: 07/14/2014 Document Reviewed: 12/01/2013 °Elsevier Interactive Patient Education ©2016 Elsevier Inc. ° °

## 2015-10-25 NOTE — Progress Notes (Signed)
Subjective:    Kimberly DillsJennifer E [name] is a 30 y.o. female being seen today for her obstetrical visit. She is at 6628w0d gestation. Patient reports backache, contractions since last night 10/24/15, fatigue, heartburn, nausea, no bleeding, no leaking, vomiting and diarrhea. Fetal movement: normal.  Problem List Items Addressed This Visit    None     Patient Active Problem List   Diagnosis Date Noted  . Anxiety 09/16/2013  . Palpitations 09/16/2013  . Chest pain 09/16/2013    Objective:    BP 129/80 mmHg  Pulse 111  Wt 169 lb (76.658 kg)  LMP 02/08/2015 FHT: 160 BPM  Uterine Size: size equals dates  Presentations: cephalic  Pelvic Exam:              Dilation: 0.5 cm       Effacement: Long             Station:  -3    Consistency: soft            Position: posterior   NST: reactive, cat. 1 tracing, moderate variability, contractions every 4-5 minutes, palpate moderate.   Assessment:    Pregnancy @ 8628w0d weeks   contractions   Gastroenteritis  Reactive NST  Plan:    Sent to MAU for evaluation Plans for delivery: Vaginal anticipated; labs reviewed; problem list updated Counseling: Consent signed. Infant feeding: plans to breastfeed. Cigarette smoking: never smoked. L&D discussion: symptoms of labor, discussed when to call, discussed what number to call, anesthetic/analgesic options reviewed and delivering clinician:  plans Certified Nurse-Midwife. Postpartum supports and preparation: circumcision discussed and contraception plans discussed.  Follow up in 1 Week.

## 2015-10-26 ENCOUNTER — Telehealth: Payer: Self-pay | Admitting: *Deleted

## 2015-10-26 LAB — STREP GP B NAA: Strep Gp B NAA: NEGATIVE

## 2015-10-26 NOTE — Telephone Encounter (Signed)
Pt called to office to know if she could increase her Omeprazole dose due to severe acid reflux.  Pt states that she has been coughing up acid at night. Return call to pt. Pt made aware per R.Denney,CNM, she may increase her dose to 3 x daily (60mg ).  Pt advised to call office if increase in dose does not help improver her symptoms. Pt states understanding.

## 2015-10-27 ENCOUNTER — Inpatient Hospital Stay (HOSPITAL_COMMUNITY)
Admission: AD | Admit: 2015-10-27 | Discharge: 2015-10-28 | Disposition: A | Discharge status: Home / Self Care | Payer: BC Managed Care – PPO | Source: Ambulatory Visit | Attending: Obstetrics | Admitting: Obstetrics

## 2015-10-28 ENCOUNTER — Encounter (HOSPITAL_COMMUNITY): Payer: Self-pay | Admitting: *Deleted

## 2015-10-28 LAB — URINALYSIS, ROUTINE W REFLEX MICROSCOPIC
GLUCOSE, UA: 100 mg/dL — AB
KETONES UR: 15 mg/dL — AB
NITRITE: NEGATIVE
Protein, ur: 30 mg/dL — AB
pH: 5.5 (ref 5.0–8.0)

## 2015-10-28 LAB — URINE MICROSCOPIC-ADD ON

## 2015-10-28 NOTE — MAU Note (Signed)
Script for SunGardMacrobid 100mg  BID x 7 days called to [facility] [address] [place].

## 2015-10-28 NOTE — Progress Notes (Signed)
Written and verbal d/c instructions given and understanding voiced. 

## 2015-10-28 NOTE — MAU Note (Signed)
I think I am having contractions since yesterday. Irregular contractions. Denies LOF or bleeding. Had n/v/d earlier this wk and had to get flds but better now.

## 2015-10-28 NOTE — Discharge Instructions (Signed)
Pregnancy and Urinary Tract Infection °A urinary tract infection (UTI) is a bacterial infection of the urinary tract. Infection of the urinary tract can include the ureters, kidneys (pyelonephritis), bladder (cystitis), and urethra (urethritis). All pregnant women should be screened for bacteria in the urinary tract. Identifying and treating a UTI will decrease the risk of preterm labor and developing more serious infections in both the mother and baby. °CAUSES °Bacteria germs cause almost all UTIs.  °RISK FACTORS °Many factors can increase your chances of getting a UTI during pregnancy. These include: °· Having a short urethra. °· Poor toilet and hygiene habits. °· Sexual intercourse. °· Blockage of urine along the urinary tract. °· Problems with the pelvic muscles or nerves. °· Diabetes. °· Obesity. °· Bladder problems after having several children. °· Previous history of UTI. °SIGNS AND SYMPTOMS  °· Pain, burning, or a stinging feeling when urinating. °· Suddenly feeling the need to urinate right away (urgency). °· Loss of bladder control (urinary incontinence). °· Frequent urination, more than is common with pregnancy. °· Lower abdominal or back discomfort. °· Cloudy urine. °· Blood in the urine (hematuria). °· Fever.  °When the kidneys are infected, the symptoms may be: °· Back pain. °· Flank pain on the right side more so than the left. °· Fever. °· Chills. °· Nausea. °· Vomiting. °DIAGNOSIS  °A urinary tract infection is usually diagnosed through urine tests. Additional tests and procedures are sometimes done. These may include: °· Ultrasound exam of the kidneys, ureters, bladder, and urethra. °· Looking in the bladder with a lighted tube (cystoscopy). °TREATMENT °Typically, UTIs can be treated with antibiotic medicines.  °HOME CARE INSTRUCTIONS  °· Only take over-the-counter or prescription medicines as directed by your health care provider. If you were prescribed antibiotics, take them as directed. Finish  them even if you start to feel better. °· Drink enough fluids to keep your urine clear or pale yellow. °· Do not have sexual intercourse until the infection is gone and your health care provider says it is okay. °· Make sure you are tested for UTIs throughout your pregnancy. These infections often come back.  °Preventing a UTI in the Future °· Practice good toilet habits. Always wipe from front to back. Use the tissue only once. °· Do not hold your urine. Empty your bladder as soon as possible when the urge comes. °· Do not douche or use deodorant sprays. °· Wash with soap and warm water around the genital area and the anus. °· Empty your bladder before and after sexual intercourse. °· Wear underwear with a cotton crotch. °· Avoid caffeine and carbonated drinks. They can irritate the bladder. °· Drink cranberry juice or take cranberry pills. This may decrease the risk of getting a UTI. °· Do not drink alcohol. °· Keep all your appointments and tests as scheduled.  °SEEK MEDICAL CARE IF:  °· Your symptoms get worse. °· You are still having fevers 2 or more days after treatment begins. °· You have a rash. °· You feel that you are having problems with medicines prescribed. °· You have abnormal vaginal discharge. °SEEK IMMEDIATE MEDICAL CARE IF:  °· You have back or flank pain. °· You have chills. °· You have blood in your urine. °· You have nausea and vomiting. °· You have contractions of your uterus. °· You have a gush of fluid from the vagina. °MAKE SURE YOU: °· Understand these instructions.   °· Will watch your condition.   °· Will get help right away if you are not doing   well or get worse.    This information is not intended to replace advice given to you by your health care provider. Make sure you discuss any questions you have with your health care provider.   Document Released: 10/18/2010 Document Revised: 04/13/2013 Document Reviewed: 01/20/2013 Elsevier Interactive Patient Education 2016 Elsevier  Inc[employer] of the uterus can occur throughout pregnancy. Contractions are not always a sign that you are in labor.  WHAT ARE BRAXTON HICKS CONTRACTIONS?  Contractions that occur before labor are called Braxton Hicks contractions, or false labor. Toward the end of pregnancy (32-34 weeks), these contractions can develop more often and may become more forceful. This is not true labor because these contractions do not result in opening (dilatation) and thinning of the cervix. They are sometimes difficult to tell apart from true labor because these contractions can be forceful and people have different pain tolerances. You should not feel embarrassed if you go to the hospital with false labor. Sometimes, the only way to tell if you are in true labor is for your health care provider to look for changes in the cervix. If there are no prenatal problems or other health problems associated with the pregnancy, it is completely safe to be sent home with false labor and await the onset of true labor. HOW CAN YOU TELL THE DIFFERENCE BETWEEN TRUE AND FALSE LABOR? False Labor  The contractions of false labor are usually shorter and not as hard as those of true labor.   The contractions are usually irregular.   The contractions are often felt in the front of the lower abdomen and in the groin.   The contractions may go away when you walk around or change positions while lying down.   The contractions get weaker and are shorter lasting as time goes on.   The contractions do not usually become progressively stronger, regular, and closer together as with true labor.  True Labor  Contractions in true labor last 30-70 seconds, become very regular, usually become more intense, and increase in frequency.   The contractions do not go away with walking.   The discomfort is usually felt in the top of the uterus and spreads to the lower abdomen and low back.   True labor  can be determined by your health care provider with an exam. This will show that the cervix is dilating and getting thinner.  WHAT TO REMEMBER  Keep up with your usual exercises and follow other instructions given by your health care provider.   Take medicines as directed by your health care provider.   Keep your regular prenatal appointments.   Eat and drink lightly if you think you are going into labor.   If Braxton Hicks contractions are making you uncomfortable:   Change your position from lying down or resting to walking, or from walking to resting.   Sit and rest in a tub of warm water.   Drink 2-3 glasses of water. Dehydration may cause these contractions.   Do slow and deep breathing several times an hour.  WHEN SHOULD I SEEK IMMEDIATE MEDICAL CARE? Seek immediate medical care if:  Your contractions become stronger, more regular, and closer together.   You have fluid leaking or gushing from your vagina.   You have a fever.   You pass blood-tinged mucus.   You have vaginal bleeding.   You have continuous abdominal pain.   You have low back pain that you never had before.  You feel your baby's head pushing down and causing pelvic pressure.   Your baby is not moving as much as it used to.    This information is not intended to replace advice given to you by your health care provider. Make sure you discuss any questions you have with your health care provider.   Document Released: 06/23/2005 Document Revised: 06/28/2013 Document Reviewed: 04/04/2013 Elsevier Interactive Patient Education [employer]2016 Elsevier Inc.

## 2015-10-28 NOTE — Progress Notes (Signed)
Dr Gaynell FaceMArshall notified of pt's admission and status. Aware of ctx pattern, sve x2, reactive FHR, and u/a results. RN to call in Macrobid 100mg  po BID for 7 days. Pt stable for d/c home

## 2015-10-29 ENCOUNTER — Inpatient Hospital Stay (HOSPITAL_COMMUNITY)
Admission: AD | Admit: 2015-10-29 | Discharge: 2015-10-31 | DRG: 775 | Disposition: A | Discharge status: Home / Self Care | Payer: BC Managed Care – PPO | Source: Ambulatory Visit | Attending: Obstetrics | Admitting: Obstetrics

## 2015-10-29 ENCOUNTER — Inpatient Hospital Stay (HOSPITAL_COMMUNITY): Payer: BC Managed Care – PPO | Admitting: Anesthesiology

## 2015-10-29 ENCOUNTER — Other Ambulatory Visit: Payer: Self-pay | Admitting: Obstetrics

## 2015-10-29 ENCOUNTER — Telehealth: Payer: Self-pay

## 2015-10-29 ENCOUNTER — Encounter (HOSPITAL_COMMUNITY): Payer: Self-pay | Admitting: *Deleted

## 2015-10-29 DIAGNOSIS — Z823 Family history of stroke: Secondary | ICD-10-CM | POA: Diagnosis not present

## 2015-10-29 DIAGNOSIS — Z818 Family history of other mental and behavioral disorders: Secondary | ICD-10-CM

## 2015-10-29 DIAGNOSIS — Z9889 Other specified postprocedural states: Secondary | ICD-10-CM | POA: Diagnosis present

## 2015-10-29 DIAGNOSIS — Z825 Family history of asthma and other chronic lower respiratory diseases: Secondary | ICD-10-CM

## 2015-10-29 DIAGNOSIS — O2343 Unspecified infection of urinary tract in pregnancy, third trimester: Secondary | ICD-10-CM

## 2015-10-29 DIAGNOSIS — IMO0001 Reserved for inherently not codable concepts without codable children: Secondary | ICD-10-CM

## 2015-10-29 LAB — URINALYSIS, ROUTINE W REFLEX MICROSCOPIC
BILIRUBIN URINE: NEGATIVE
GLUCOSE, UA: NEGATIVE mg/dL
Hgb urine dipstick: NEGATIVE
KETONES UR: NEGATIVE mg/dL
LEUKOCYTES UA: NEGATIVE
NITRITE: NEGATIVE
PROTEIN: NEGATIVE mg/dL
Specific Gravity, Urine: <1.005 — ABNORMAL LOW (ref 1.005–1.030)
pH: 6 (ref 5.0–8.0)

## 2015-10-29 LAB — CBC
HCT: 28.9 % — ABNORMAL LOW (ref 36.0–46.0)
Hemoglobin: 9.7 g/dL — ABNORMAL LOW (ref 12.0–15.0)
MCH: 29.3 pg (ref 26.0–34.0)
MCHC: 33.6 g/dL (ref 30.0–36.0)
MCV: 87.3 fL (ref 78.0–100.0)
PLATELETS: 178 10*3/uL (ref 150–400)
RBC: 3.31 MIL/uL — AB (ref 3.87–5.11)
RDW: 16.3 % — AB (ref 11.5–15.5)
WBC: 5.2 10*3/uL (ref 4.0–10.5)

## 2015-10-29 MED ORDER — ACETAMINOPHEN 325 MG PO TABS: 650.0000 mg | ORAL_TABLET | ORAL | Status: DC | PRN

## 2015-10-29 MED ORDER — LIDOCAINE HCL (PF) 1 % IJ SOLN
INTRAMUSCULAR | Status: DC | PRN
  Administered 2015-10-29 (×2): 5 mL via EPIDURAL

## 2015-10-29 MED ORDER — SENNOSIDES-DOCUSATE SODIUM 8.6-50 MG PO TABS
2.0000 | ORAL_TABLET | ORAL | Status: DC
  Administered 2015-10-30 – 2015-10-31 (×2): 2 via ORAL
  Filled 2015-10-29 (×2): qty 2

## 2015-10-29 MED ORDER — EPHEDRINE 5 MG/ML INJ
10.0000 mg | INTRAVENOUS | Status: DC | PRN
  Filled 2015-10-29: qty 2

## 2015-10-29 MED ORDER — SIMETHICONE 80 MG PO CHEW: 80.0000 mg | CHEWABLE_TABLET | ORAL | Status: DC | PRN

## 2015-10-29 MED ORDER — SODIUM CHLORIDE 0.9 % IV SOLN: 2.0000 g | Freq: Once | INTRAVENOUS | Status: DC

## 2015-10-29 MED ORDER — WITCH HAZEL-GLYCERIN EX PADS: 1.0000 "application " | MEDICATED_PAD | CUTANEOUS | Status: DC | PRN

## 2015-10-29 MED ORDER — FENTANYL CITRATE (PF) 100 MCG/2ML IJ SOLN
100.0000 ug | INTRAMUSCULAR | Status: DC | PRN
  Administered 2015-10-29 (×2): 100 ug via INTRAVENOUS
  Filled 2015-10-29 (×2): qty 2

## 2015-10-29 MED ORDER — PHENYLEPHRINE 40 MCG/ML (10ML) SYRINGE FOR IV PUSH (FOR BLOOD PRESSURE SUPPORT)
80.0000 ug | PREFILLED_SYRINGE | INTRAVENOUS | Status: DC | PRN
  Filled 2015-10-29: qty 5

## 2015-10-29 MED ORDER — EPHEDRINE 5 MG/ML INJ: 10.0000 mg | INTRAVENOUS | Status: DC | PRN

## 2015-10-29 MED ORDER — DIPHENHYDRAMINE HCL 25 MG PO CAPS: 25.0000 mg | ORAL_CAPSULE | Freq: Four times a day (QID) | ORAL | Status: DC | PRN

## 2015-10-29 MED ORDER — METHYLERGONOVINE MALEATE 0.2 MG/ML IJ SOLN: 0.2000 mg | INTRAMUSCULAR | Status: DC | PRN

## 2015-10-29 MED ORDER — MEASLES, MUMPS & RUBELLA VAC ~~LOC~~ INJ: 0.5000 mL | INJECTION | Freq: Once | SUBCUTANEOUS | Status: DC

## 2015-10-29 MED ORDER — FLEET ENEMA 7-19 GM/118ML RE ENEM: 1.0000 | ENEMA | RECTAL | Status: DC | PRN

## 2015-10-29 MED ORDER — ONDANSETRON HCL 4 MG/2ML IJ SOLN
4.0000 mg | Freq: Four times a day (QID) | INTRAMUSCULAR | Status: DC | PRN
  Administered 2015-10-29: 4 mg via INTRAVENOUS
  Filled 2015-10-29: qty 2

## 2015-10-29 MED ORDER — FUSION PLUS PO CAPS: 1.0000 | ORAL_CAPSULE | Freq: Every day | ORAL | Status: DC

## 2015-10-29 MED ORDER — BUTORPHANOL TARTRATE 1 MG/ML IJ SOLN: 1.0000 mg | INTRAMUSCULAR | Status: DC | PRN

## 2015-10-29 MED ORDER — PHENYLEPHRINE 40 MCG/ML (10ML) SYRINGE FOR IV PUSH (FOR BLOOD PRESSURE SUPPORT): 80.0000 ug | PREFILLED_SYRINGE | INTRAVENOUS | Status: DC | PRN

## 2015-10-29 MED ORDER — ONDANSETRON HCL 4 MG/2ML IJ SOLN: 4.0000 mg | INTRAMUSCULAR | Status: DC | PRN

## 2015-10-29 MED ORDER — ACETAMINOPHEN 325 MG PO TABS
650.0000 mg | ORAL_TABLET | ORAL | Status: DC | PRN
  Administered 2015-10-29 – 2015-10-31 (×4): 650 mg via ORAL
  Filled 2015-10-29 (×5): qty 2

## 2015-10-29 MED ORDER — LACTATED RINGERS IV SOLN
2.5000 [IU]/h | INTRAVENOUS | Status: DC
  Filled 2015-10-29: qty 4

## 2015-10-29 MED ORDER — PHENYLEPHRINE 40 MCG/ML (10ML) SYRINGE FOR IV PUSH (FOR BLOOD PRESSURE SUPPORT)
80.0000 ug | PREFILLED_SYRINGE | INTRAVENOUS | Status: DC | PRN
  Filled 2015-10-29: qty 5
  Filled 2015-10-29: qty 20

## 2015-10-29 MED ORDER — CITRIC ACID-SODIUM CITRATE 334-500 MG/5ML PO SOLN: 30.0000 mL | ORAL | Status: DC | PRN

## 2015-10-29 MED ORDER — PHENAZOPYRIDINE HCL 100 MG PO TABS: 100.0000 mg | ORAL_TABLET | Freq: Once | ORAL | Status: DC

## 2015-10-29 MED ORDER — OXYMETAZOLINE HCL 0.05 % NA SOLN
2.0000 | Freq: Two times a day (BID) | NASAL | Status: DC
Start: 2015-10-29 — End: 2015-10-31
  Administered 2015-10-29: 2 via NASAL
  Filled 2015-10-29: qty 15

## 2015-10-29 MED ORDER — MISOPROSTOL 200 MCG PO TABS
ORAL_TABLET | ORAL | Status: AC
Start: 2015-10-29 — End: 2015-10-30
  Filled 2015-10-29: qty 5

## 2015-10-29 MED ORDER — COMPLETENATE 29-1 MG PO CHEW
1.0000 | CHEWABLE_TABLET | Freq: Every day | ORAL | Status: DC
  Filled 2015-10-29 (×3): qty 1

## 2015-10-29 MED ORDER — OXYTOCIN 10 UNIT/ML IJ SOLN
1.0000 m[IU]/min | INTRAVENOUS | Status: DC
  Administered 2015-10-29: 2 m[IU]/min via INTRAVENOUS

## 2015-10-29 MED ORDER — DIPHENHYDRAMINE HCL 50 MG/ML IJ SOLN: 12.5000 mg | INTRAMUSCULAR | Status: DC | PRN

## 2015-10-29 MED ORDER — LACTATED RINGERS IV SOLN: 500.0000 mL | Freq: Once | INTRAVENOUS | Status: DC

## 2015-10-29 MED ORDER — HYDROMORPHONE HCL 2 MG PO TABS: 4.0000 mg | ORAL_TABLET | ORAL | Status: DC | PRN

## 2015-10-29 MED ORDER — LACTATED RINGERS IV SOLN
500.0000 mL | INTRAVENOUS | Status: DC | PRN
  Administered 2015-10-29: 500 mL via INTRAVENOUS

## 2015-10-29 MED ORDER — LIDOCAINE HCL (PF) 1 % IJ SOLN
30.0000 mL | INTRAMUSCULAR | Status: DC | PRN
  Filled 2015-10-29: qty 30

## 2015-10-29 MED ORDER — DIBUCAINE 1 % RE OINT: 1.0000 "application " | TOPICAL_OINTMENT | RECTAL | Status: DC | PRN

## 2015-10-29 MED ORDER — LACTATED RINGERS IV SOLN
INTRAVENOUS | Status: DC
  Administered 2015-10-29 (×2): via INTRAVENOUS

## 2015-10-29 MED ORDER — METHYLERGONOVINE MALEATE 0.2 MG PO TABS: 0.2000 mg | ORAL_TABLET | ORAL | Status: DC | PRN

## 2015-10-29 MED ORDER — BISACODYL 10 MG RE SUPP: 10.0000 mg | Freq: Every day | RECTAL | Status: DC | PRN

## 2015-10-29 MED ORDER — ONDANSETRON HCL 4 MG PO TABS: 4.0000 mg | ORAL_TABLET | ORAL | Status: DC | PRN

## 2015-10-29 MED ORDER — CEFTRIAXONE SODIUM 1 G IJ SOLR: 500.0000 mg | Freq: Once | INTRAMUSCULAR | Status: DC

## 2015-10-29 MED ORDER — FLEET ENEMA 7-19 GM/118ML RE ENEM: 1.0000 | ENEMA | Freq: Every day | RECTAL | Status: DC | PRN

## 2015-10-29 MED ORDER — ALBUTEROL SULFATE (2.5 MG/3ML) 0.083% IN NEBU
3.0000 mL | INHALATION_SOLUTION | Freq: Four times a day (QID) | RESPIRATORY_TRACT | Status: DC | PRN
  Administered 2015-10-30: 3 mL via RESPIRATORY_TRACT
  Filled 2015-10-29: qty 6
  Filled 2015-10-29: qty 3

## 2015-10-29 MED ORDER — MEDROXYPROGESTERONE ACETATE 150 MG/ML IM SUSP: 150.0000 mg | INTRAMUSCULAR | Status: DC | PRN

## 2015-10-29 MED ORDER — OXYTOCIN BOLUS FROM INFUSION
500.0000 mL | INTRAVENOUS | Status: DC
  Administered 2015-10-29: 500 mL via INTRAVENOUS

## 2015-10-29 MED ORDER — VITAFOL GUMMIES 3.33-0.333-34.8 MG PO CHEW: 3.0000 | CHEWABLE_TABLET | Freq: Every day | ORAL | Status: DC

## 2015-10-29 MED ORDER — NITROFURANTOIN MONOHYD MACRO 100 MG PO CAPS
100.0000 mg | ORAL_CAPSULE | Freq: Two times a day (BID) | ORAL | Status: DC
  Administered 2015-10-29 – 2015-10-31 (×4): 100 mg via ORAL
  Filled 2015-10-29 (×6): qty 1

## 2015-10-29 MED ORDER — IBUPROFEN 600 MG PO TABS
600.0000 mg | ORAL_TABLET | Freq: Four times a day (QID) | ORAL | Status: DC
  Administered 2015-10-29 – 2015-10-31 (×8): 600 mg via ORAL
  Filled 2015-10-29 (×7): qty 1

## 2015-10-29 MED ORDER — TERBUTALINE SULFATE 1 MG/ML IJ SOLN
0.2500 mg | Freq: Once | INTRAMUSCULAR | Status: DC | PRN
  Filled 2015-10-29: qty 1

## 2015-10-29 MED ORDER — FERROUS SULFATE 325 (65 FE) MG PO TABS
325.0000 mg | ORAL_TABLET | Freq: Two times a day (BID) | ORAL | Status: DC
  Administered 2015-10-30 – 2015-10-31 (×3): 325 mg via ORAL
  Filled 2015-10-29 (×3): qty 1

## 2015-10-29 MED ORDER — MISOPROSTOL 200 MCG PO TABS
1000.0000 ug | ORAL_TABLET | Freq: Once | ORAL | Status: AC
  Administered 2015-10-29: 1000 ug via RECTAL

## 2015-10-29 MED ORDER — ZOLPIDEM TARTRATE 5 MG PO TABS: 5.0000 mg | ORAL_TABLET | Freq: Every evening | ORAL | Status: DC | PRN

## 2015-10-29 MED ORDER — FENTANYL 2.5 MCG/ML BUPIVACAINE 1/10 % EPIDURAL INFUSION (WH - ANES)
14.0000 mL/h | INTRAMUSCULAR | Status: DC | PRN
  Administered 2015-10-29 (×2): 14 mL/h via EPIDURAL
  Filled 2015-10-29 (×2): qty 125

## 2015-10-29 MED ORDER — OXYTOCIN 10 UNIT/ML IJ SOLN: 2.5000 [IU]/h | INTRAVENOUS | Status: DC | PRN

## 2015-10-29 MED ORDER — PANTOPRAZOLE SODIUM 40 MG PO TBEC
40.0000 mg | DELAYED_RELEASE_TABLET | Freq: Every day | ORAL | Status: DC
  Administered 2015-10-29 – 2015-10-31 (×3): 40 mg via ORAL
  Filled 2015-10-29 (×3): qty 1

## 2015-10-29 MED ORDER — TETANUS-DIPHTH-ACELL PERTUSSIS 5-2.5-18.5 LF-MCG/0.5 IM SUSP
0.5000 mL | Freq: Once | INTRAMUSCULAR | Status: DC
  Filled 2015-10-29: qty 0.5

## 2015-10-29 MED ORDER — MISOPROSTOL 50MCG HALF TABLET: 100.0000 ug | ORAL_TABLET | Freq: Once | ORAL | Status: DC

## 2015-10-29 MED ORDER — BENZOCAINE-MENTHOL 20-0.5 % EX AERO
1.0000 "application " | INHALATION_SPRAY | CUTANEOUS | Status: DC | PRN
  Administered 2015-10-29 – 2015-10-31 (×2): 1 via TOPICAL
  Filled 2015-10-29 (×2): qty 56

## 2015-10-29 MED ORDER — COCONUT OIL OIL: 1.0000 "application " | TOPICAL_OIL | Status: DC | PRN

## 2015-10-29 MED ORDER — LACTATED RINGERS IV SOLN: 1.0000 m[IU]/min | INTRAVENOUS | Status: DC

## 2015-10-29 NOTE — Anesthesia Preprocedure Evaluation (Signed)
Anesthesia Evaluation  Patient identified by MRN, date of birth, ID band Patient awake    Reviewed: Allergy & Precautions, H&P , NPO status , Patient's Chart, lab work & pertinent test results  Airway Mallampati: II  TM Distance: >3 FB Neck ROM: full    Dental no notable dental hx. (+) Dental Advisory Given, Teeth Intact   Pulmonary neg pulmonary ROS,    Pulmonary exam normal breath sounds clear to auscultation       Cardiovascular Exercise Tolerance: Good negative cardio ROS Normal cardiovascular exam Rhythm:regular Rate:Normal  Palpitations CP from anxiety   Neuro/Psych Anxiety negative neurological ROS  negative psych ROS   GI/Hepatic negative GI ROS, Neg liver ROS,   Endo/Other  negative endocrine ROS  Renal/GU negative Renal ROS  negative genitourinary   Musculoskeletal   Abdominal   Peds  Hematology negative hematology ROS (+)   Anesthesia Other Findings   Reproductive/Obstetrics negative OB ROS (+) Pregnancy                             Anesthesia Physical Anesthesia Plan  ASA: II  Anesthesia Plan: Epidural   Post-op Pain Management:    Induction:   Airway Management Planned:   Additional Equipment:   Intra-op Plan:   Post-operative Plan:   Informed Consent: I have reviewed the patients History and Physical, chart, labs and discussed the procedure including the risks, benefits and alternatives for the proposed anesthesia with the patient or authorized representative who has indicated his/her understanding and acceptance.   Dental Advisory Given  Plan Discussed with: CRNA  Anesthesia Plan Comments:         Anesthesia Quick Evaluation

## 2015-10-29 NOTE — MAU Note (Signed)
PT SAYS  SHE WAS HERE LAST NIGHT -    CRAMPING.   WAS TOLD  SHE HAD UTI-  GAVE  RX  FOR  MEDS  BUT  SHE DID NOT   HAVE TIME  TO PICK UP MED TODAY SO SHE TOOK OLD MED.   THEN TODAY  SHE HAD MUCUS    .  VE YESTERDAY  FT.    DENIES HSV AND MRSA.     GBS- NEG SHE THINKS.

## 2015-10-29 NOTE — Anesthesia Postprocedure Evaluation (Signed)
Anesthesia Post Note  Patient: Kimberly [name]  Procedure(s) Performed: * No procedures listed *  Patient location during evaluation: Mother Baby Anesthesia Type: Epidural Level of consciousness: awake and alert Pain management: satisfactory to patient Vital Signs Assessment: post-procedure vital signs reviewed and stable Respiratory status: respiratory function stable Cardiovascular status: stable Postop Assessment: no headache, no backache, epidural receding, patient able to bend at knees, no signs of nausea or vomiting and adequate PO intake Anesthetic complications: no Comments: Comfort level was assessed by AnesthesiaTeam and the patient was pleased with the care, interventions, and services provided by the Department of Anesthesia.    Last Vitals:  Filed Vitals:   10/29/15 1745 10/29/15 1845  BP: 117/59 100/63  Pulse: 73 56  Temp: 37.4 C 37.4 C  Resp: 18 18    Last Pain:  Filed Vitals:   10/29/15 2052  PainSc: 5                  Madisyn Mawhinney

## 2015-10-29 NOTE — Anesthesia Procedure Notes (Signed)
Epidural Patient location during procedure: OB Start time: 10/29/2015 5:40 AM End time: 10/29/2015 5:45 AM  Staffing Anesthesiologist: Ronelle NighEWELL, Ginnette Gates Performed by: anesthesiologist   Preanesthetic Checklist Completed: patient identified, site marked, surgical consent, pre-op evaluation, timeout performed, IV checked, risks and benefits discussed and monitors and equipment checked  Epidural Patient position: sitting Prep: site prepped and draped and DuraPrep Patient monitoring: continuous pulse ox and blood pressure Approach: midline Location: L3-L4 Injection technique: LOR air  Needle:  Needle type: Tuohy  Needle gauge: 17 G Needle length: 9 cm and 9 Needle insertion depth: 5 cm cm Catheter type: closed end flexible Catheter size: 19 Gauge Catheter at skin depth: 10 cm Test dose: negative  Assessment Sensory level: T8 Events: blood not aspirated, injection not painful, no injection resistance, negative IV test and no paresthesia  Additional Notes Patient identified. Risks/Benefits/Options discussed with patient including but not limited to bleeding, infection, nerve damage, paralysis, failed block, incomplete pain control, headache, blood pressure changes, nausea, vomiting, reactions to medication both or allergic, itching and postpartum back pain. Confirmed with bedside nurse the patient's most recent platelet count. Confirmed with patient that they are not currently taking any anticoagulation, have any bleeding history or any family history of bleeding disorders. Patient expressed understanding and wished to proceed. All questions were answered. Sterile technique was used throughout the entire procedure. Please see nursing notes for vital signs. Test dose was given through epidural catheter and negative prior to continuing to dose epidural or start infusion. Warning signs of high block given to the patient including shortness of breath, tingling/numbness in hands, complete motor  block, or any concerning symptoms with instructions to call for help. Patient was given instructions on fall risk and not to get out of bed. All questions and concerns addressed with instructions to call with any issues or inadequate analgesia.

## 2015-10-29 NOTE — Progress Notes (Signed)
Pt temp 100.7. Dr Clearance CootsHarper notified no new orders at this time.

## 2015-10-29 NOTE — Telephone Encounter (Signed)
PATIENT IN HOSPITAL IN LABOR WHEN CALLED ABOUT FMLA PAPERWORK - WENT ON AND FAXED TO HER JOB, PATIENT STATED WOULD PAY WHEN SHE CAME FOR POSTPARTUM VISIT

## 2015-10-29 NOTE — H&P (Signed)
Kimberly [name] is a 30 y.o. female presenting for active labor at term. History OB History    Gravida Para Term Preterm AB TAB SAB Ectopic Multiple Living   1         0     Past Medical History  Diagnosis Date  . Anxiety   . Palpitations   . Chest pain   . Mononucleosis    Past Surgical History  Procedure Laterality Date  . Nasal sinus surgery    . Tonsillectomy and adenoidectomy    . Adenoidectomy     Family History: family history includes Anxiety disorder in her father and sister; Arrhythmia in her maternal grandmother; Asthma in her mother; Cancer in her paternal grandfather; Depression in her father; Heart Problems in her maternal grandmother; Other in her father; Stroke in her maternal grandmother; Thyroid disease in her father. Social History:  reports that she has never smoked. She has never used smokeless tobacco. She reports that she does not drink alcohol or use illicit drugs.   Prenatal Transfer Tool  Maternal Diabetes: No Genetic Screening: Normal Maternal Ultrasounds/Referrals: Normal Fetal Ultrasounds or other Referrals:  None Maternal Substance Abuse:  No Significant Maternal Medications:  Meds include: Other: Wellbutrin Significant Maternal Lab Results:  Lab values include: Other: pending urine culture Other Comments:  None  ROS  Dilation: 6.5 Effacement (%): 80 Station: -1 Exam by:: E. Johnson. RN  Blood pressure 107/83, pulse 132, temperature 98.2 F (36.8 C), temperature source Oral, resp. rate 17, last menstrual period 02/08/2015, SpO2 98 %. Exam Physical Exam  Prenatal labs: ABO, Rh: --/--/A NEG (04/24 0235) Antibody: POS (04/24 0235) Rubella: 2.85 (09/15 1407) RPR: NON REAC (02/16 1107)  HBsAg: NEGATIVE (09/15 1407)  HIV: NONREACTIVE (02/16 1107)  GBS: Negative (04/20 0000)   Assessment/Plan: Admit for active labor at term.  Anticipate NSVD.    Rachelle A Denney 10/29/2015, 7:12 AM

## 2015-10-30 LAB — CBC
HEMATOCRIT: 26.4 % — AB (ref 36.0–46.0)
HEMOGLOBIN: 8.8 g/dL — AB (ref 12.0–15.0)
MCH: 28.6 pg (ref 26.0–34.0)
MCHC: 33.3 g/dL (ref 30.0–36.0)
MCV: 85.7 fL (ref 78.0–100.0)
PLATELETS: 138 10*3/uL — AB (ref 150–400)
RBC: 3.08 MIL/uL — AB (ref 3.87–5.11)
RDW: 16.3 % — ABNORMAL HIGH (ref 11.5–15.5)
WBC: 8.7 10*3/uL (ref 4.0–10.5)

## 2015-10-30 LAB — RPR: RPR: NONREACTIVE

## 2015-10-30 MED ORDER — RHO D IMMUNE GLOBULIN 1500 UNIT/2ML IJ SOSY
300.0000 ug | PREFILLED_SYRINGE | Freq: Once | INTRAMUSCULAR | Status: AC
  Administered 2015-10-30: 300 ug via INTRAVENOUS
  Filled 2015-10-30: qty 2

## 2015-10-30 NOTE — Lactation Note (Signed)
This note was copied from a baby's chart. Lactation Consultation Note New mom! Has small bun like breast w/small nipples that doesn't compress, has no shaft on nipple, yet aren't flat. Baby can't latch to breast w/o NS. [facility] Ne[facility] #16 NS. Gave shells to wear in bra to assist in everting nipple. Hand expressed thick colostrum. When entered rm. FOB was trying to hand express mom. Mom stated "I dont have anything, it's not coming out. Explained thickness. Hand expressed. Baby abdomin slightly distended. Burped 2 times while at the breast. Taught NS application. Mom and dad inexperienced parents with a lot of questions. Mom appears to need to be told at least 3-4 times. Mom encouraged to feed baby 8-12 times/24 hours and with feeding cues. Referred to Baby and Me Book in Breastfeeding section Pg. 22-23 for position options and Proper latch demonstration. Educated about newborn behavior, STS,I&O, cluster feeding, supply and demand. Encouraged comfort during BF so colostrum flows better and mom will enjoy the feeding longer. Taking deep breaths and breast massage during BF. WH/LC brochure given w/resources, support groups and LC services. Baby resisted at first latching, then after finally suckled. LC feels that parents will need to be worked with a lot before d/c home. Patient Name: Kimberly Bing QuarryJennifer Butch HQION'GToday's Date: 10/30/2015 Reason for consult: Initial assessment   Maternal Data Has patient been taught Hand Expression?: Yes Does the patient have breastfeeding experience prior to this delivery?: No  Feeding Feeding Type: Breast Milk Length of feed: 10 min  LATCH Score/Interventions Latch: Repeated attempts needed to sustain latch, nipple held in mouth throughout feeding, stimulation needed to elicit sucking reflex. Intervention(s): Adjust position;Assist with latch;Breast massage;Breast compression  Audible Swallowing: None Intervention(s): Hand expression  Type of  Nipple: Everted at rest and after stimulation Intervention(s): Shells;Hand pump  Comfort (Breast/Nipple): Soft / non-tender     Hold (Positioning): Full assist, staff holds infant at breast Intervention(s): Breastfeeding basics reviewed;Support Pillows;Position options;Skin to skin  LATCH Score: 5  Lactation Tools Discussed/Used Tools: Shells;Nipple Shields;Pump;Flanges Nipple shield size: 16 Flange Size: Other (comment) (21) Shell Type: Inverted Breast pump type: Manual WIC Program: No Pump Review: Setup, frequency, and cleaning;Milk Storage Initiated by:: RN Date initiated:: 10/29/15   Consult Status Consult Status: Follow-up Date: 10/30/15 Follow-up type: In-patient    Charyl DancerCARVER, Avynn Klassen G 10/30/2015, 6:36 AM

## 2015-10-30 NOTE — Progress Notes (Signed)
Post Partum Day #1 Subjective: no complaints, up ad lib, voiding and tolerating PO  Objective: Blood pressure 96/62, pulse 58, temperature 97.8 F (36.6 C), temperature source Oral, resp. rate 18, last menstrual period 02/08/2015, SpO2 98 %, unknown if currently breastfeeding.  Physical Exam:  General: alert, cooperative and no distress Lochia: appropriate Uterine Fundus: firm Incision: healing well, no significant erythema DVT Evaluation: No evidence of DVT seen on physical exam. No cords or calf tenderness. No significant calf/ankle edema.   Recent Labs  10/29/15 0235 10/30/15 0508  HGB 9.7* 8.8*  HCT 28.9* 26.4*    Assessment/Plan: Plan for discharge tomorrow, Breastfeeding and Lactation consult   LOS: 1 day   Roe CoombsRachelle A Chrissa Meetze, CNM 10/30/2015, 8:38 AM

## 2015-10-30 NOTE — Lactation Note (Signed)
This note was copied from a baby's chart. Lactation Consultation Note  Patient Name: [name] ZOXWR'UToday's Date: 10/30/2015  Baby at 24 hr of life and parents were calling for lactation because they think baby is not eating enough. Discussed baby behavior, feeding frequency, pumping, baby belly size, voids, wt loss, breast changes, and nipple care. Mom can manually express, has seen colostrum bilaterally, has a spoon and oral syring in the room. FOB stated that he can finger feed baby well. Parents are very nervous and have a lot of questions. Encouraged them to keep offering the baby the breast on demand 8+/24hr, manually express and syring feeding as needed, and using the NS to help baby latch as needed. They aware of lactation services and support group.      Maternal Data    Feeding    LATCH Score/Interventions                      Lactation Tools Discussed/Used     Consult Status      Kimberly [name] 10/30/2015, 4:10 PM

## 2015-10-31 ENCOUNTER — Encounter: Payer: BC Managed Care – PPO | Admitting: Certified Nurse Midwife

## 2015-10-31 LAB — RH IG WORKUP (INCLUDES ABO/RH)
ABO/RH(D): A NEG
FETAL SCREEN: NEGATIVE
Gestational Age(Wks): 37.4
UNIT DIVISION: 0

## 2015-10-31 MED ORDER — IBUPROFEN 600 MG PO TABS: 600.0000 mg | ORAL_TABLET | Freq: Four times a day (QID) | ORAL | Status: AC

## 2015-10-31 MED ORDER — COCONUT OIL OIL: 1.0000 "application " | TOPICAL_OIL | Status: DC | PRN

## 2015-10-31 MED ORDER — WITCH HAZEL-GLYCERIN EX PADS: 1.0000 "application " | MEDICATED_PAD | CUTANEOUS | Status: DC | PRN

## 2015-10-31 MED ORDER — GUAIFENESIN-CODEINE 100-10 MG/5ML PO SOLN
10.0000 mL | ORAL | Status: DC | PRN
Start: 2015-10-31 — End: 2015-10-31
  Administered 2015-10-31: 10 mL via ORAL
  Filled 2015-10-31: qty 10

## 2015-10-31 MED ORDER — HYDROMORPHONE HCL 4 MG PO TABS: 4.0000 mg | ORAL_TABLET | ORAL | Status: DC | PRN

## 2015-10-31 MED ORDER — BENZOCAINE-MENTHOL 20-0.5 % EX AERO: 1.0000 "application " | INHALATION_SPRAY | CUTANEOUS | Status: DC | PRN

## 2015-10-31 MED ORDER — DIBUCAINE 1 % RE OINT: 1.0000 "application " | TOPICAL_OINTMENT | RECTAL | Status: DC | PRN

## 2015-10-31 MED ORDER — SENNOSIDES-DOCUSATE SODIUM 8.6-50 MG PO TABS: 2.0000 | ORAL_TABLET | ORAL | Status: DC

## 2015-10-31 NOTE — Discharge Summary (Signed)
Obstetric Discharge Summary Reason for Admission: onset of labor and early term Prenatal Procedures: ultrasound Intrapartum Procedures: spontaneous vaginal delivery Postpartum Procedures: none Complications-Operative and Postpartum: 2nd degree perineal laceration HEMOGLOBIN  Date Value Ref Range Status  10/30/2015 8.8* 12.0 - 15.0 g/dL Final   HCT  Date Value Ref Range Status  10/30/2015 26.4* 36.0 - 46.0 % Final    Physical Exam:  General: alert, cooperative and no distress Lochia: appropriate Uterine Fundus: firm Incision: healing well, no significant drainage DVT Evaluation: No evidence of DVT seen on physical exam. No cords or calf tenderness. No significant calf/ankle edema.  Discharge Diagnoses: Term Pregnancy-delivered  Discharge Information: Date: 10/31/2015 Activity: pelvic rest Diet: routine Medications: PNV, Ibuprofen, Colace, Iron and Percocet Condition: stable Instructions: refer to practice specific booklet Discharge to: home   Newborn Data: Live born female  Birth Weight: 8 lb 1.1 oz (3660 g) APGAR: 9, 9  Home with mother.  Roe Coombsachelle A Kadisha Goodine, CNM 10/31/2015, 8:23 AM

## 2015-10-31 NOTE — Progress Notes (Signed)
Dr. Clearance CootsHarper notified to get order for a cough medicine for patient. Awaiting order.

## 2015-10-31 NOTE — Progress Notes (Signed)
CLINICAL SOCIAL WORK MATERNAL/CHILD NOTE  Patient Details  Name: Kimberly [name] MRN: [id number] Date of Birth: 10/29/2016  Date:  10/30/2015  Clinical Social Worker Initiating Note:  Elissa Hefty, MSW intern  Date/ Time Initiated:  10/30/15/1325     Child's Name:  Kimberly [name]    Legal Guardian:  [name] and [name]    Need for Interpreter:  None   Date of Referral:  10/29/15     Reason for Referral:  Behavioral Health Issues, including SI    Referral Source:  [facility]   Address:  [address] [place], [place] [address]   Phone number:  [phone]   Household Members:  Self, Spouse   Natural Supports (not living in the home):  Immediate Family, Parent, Spouse/significant other   Professional Supports: Psychiatrist- Carollee Herter [address] [place], [place] [phone]   Employment: Full-time   Type of Work:     Education:  Engineer, maintenance Resources:  Multimedia programmer   Other Resources:      Cultural/Religious Considerations Which May Impact Care:  None Reported   Strengths:  Ability to meet basic needs , Home prepared for child , Understanding of illness   Risk Factors/Current Problems:  Mental Health Concerns - Per [name], she has a diagnosis of anxiety, OCD, and depression. [name] expressed she has recently been experiencing symptoms of anxiety and OCD. [name] stated she has not experienced symptoms of depression in over 7 years. According to [facility], she was last seen about 9 months ago by her psychiatrist, Carollee Herter in [place]. [name] reported she attended therapy weekly when she was first diagnosed over 7 years ago.   Cognitive State:  Insightful , Able to Concentrate , Goal Oriented    Mood/Affect:  Happy , Interested , Calm , Comfortable    CSW Assessment:  MSW intern presented in the patient's room due a consult being placed by the central nursery because of a history of anxiety. [name] was alone in the room  and provided verbal consent for MSW intern to engage. [name] presented to be in a happy mood as evidence by her engaging in the assessment and bonding with the infant. [name] reported the birthing process went well and she was recovering well despite voicing being sore. According to [name], this was her first child and expressed being "nervous" about going home. According to [name], reality had finally hit her and she "nervous" about how she would do as a mother and care for the infant. [name] reported that she had a history of anxiety and had been prescribed medications prior to and during the pregnancy. [name] shared that she tried several medications during the pregnancy but had to stop taking them because she could not keep them in and would vomit them immediately. [name] voiced an interest in restarting her medications while at the hospital. MSW intern processed [name]'s expressed feelings in regard to the infant and motherhood. MSW intern asked [name] to define what being an good mother meant to her. [name] defined it as making sure the infant was provided all her needs and that she knew her mother loved her. MSW intern asked [name] how she could meet those needs. [name] stated that she had met all of the infant's basic needs and was caring for her in order to show her love for her. [name] expressed breastfeeding being difficult and "worrying" about the infant's milk intake. [name] shared the lactation nurses had been working with her  and had provided her with various tools to use while breastfeeding. MSW intern processed [name]'s feelings in regard to breastfeeding. [name] was able to recognize it is a learning process and she just needs to continue working on it with the infant. [name] expressed feelings of happiness in regard to being a mother and being prepared to take the infant home. [name] reported having a great support system from FOB and her family.   MSW intern inquired about the events that led to [name]'s diagnoses. [name] shared she was diagnosed with  depression, anxiety, and OCD when she started college. [name] voiced the change from living with her parents to living on her own created a "stressor" for her. [name] stated she had a hard time adjusting to being away from her home and her old routine. According to [facility], she attended therapy weekly about 7 years ago but has not attended therapy recently. [name] disclosed she saw her psychiatrist about 9 months ago in [place] and was prescribed medications for her anxiety. [name] identified her anxiety and OCD to be a more accurate diagnosis. [name] denied feelings of depression during the pregnancy. [name] reported that during the pregnancy she "worried" a lot and "feared doing something wrong" once the infant was born. [name] shared she repeated various behaviors when preparing the home for the infant and within her daily routine. [name] expressed she was prescribed various medications during the pregnancy but stopped taking them because she would vomit them immediately or they made her nauseous.Per [name], she was on Xanax, Celexa, Pristique, and Wellburtin.  [name] voiced an interest in restarting her medications and wanting to be proactive in regard to her mental health. [name] stated she would call her pharmacist to get an up to date list of her last prescribed medications because she felt the last medication she was prescribed was the most beneficial for her. [name] emphasized she was "nervous" in caring for the infant and wanted to be able to manage her anxiety before it got "worse". MSW intern provided education on PMADs and with additional online resources and handouts containing more information on the topic. [name] requested [name] to inform her doctor that she wanted to restart her medications and to provide her with a list of psychiatrist in the area she lives now. [name] denied having any further questions or concerns. [name] thanked MSW intern for checking in on her because she was feeling like she needed to talk to someone about her current "fears  and worries".   MSW intern asked [name] what coping mechanisms she found helpful during the pregnancy. [name] shared she found being occupied to serve as a distractor for her. MSW intern went over various coping mechanisms other mothers have found beneficial in the past. [name] agreed to put some of those into practice in order to help with her anxiety. [name] stated having an good communication with FOB has been helpful for her as well.   [name] denied having any further questions or concerns but agreed to contact MSW intern if needs arise.   MSW intern followed up with [name]'s nurse in regard to her desire to start her medications while at the hospital.   CSW Plan/Description:   Patient/Family Education- MSW intern provided education on PMADs and the hospital's support group, Feelings After Birth.  Information/Referral to [employer]- [name] requested a list of psychiatrist in the area she lives now.  No Further Intervention Required/No Barriers to Discharge    Trevor Iha, Student-SW 10/30/2015, 2:08 PM

## 2015-10-31 NOTE — Lactation Note (Signed)
This note was copied from a baby's chart. Lactation Consultation Note  Patient Name: Kimberly Bing QuarryJennifer [name] ZOXWR'UToday's Date: 10/31/2015 Reason for consult: Follow-up assessment  Baby 41 hours old. Parents report that they have just finished supplementing baby. Mom states that she is not seeing anything when she pumps or hand expresses--but she has only pumped twice since yesterday. Discussed the need to pump at least 8 times/24 hours for 15 minutes followed by hand expression. Discussed how the pump replaces the stimulation at the breast that the baby is not providing. Enc mom to put baby to breast with cues and at least every 3 hours, then supplement with EBM/formula according to supplementation guidelines, and then pump and hand express afterwards. Reviewed EBM storage guidelines and number of diapers to expect by day of life. Enc parents to call insurance about DEBP and parents aware of [facility]. Parents have this LC's number if needing a DEBP.   Discussed with parents that baby is used to flow of bottle, and baby should latch better as mom's milk comes to volume. Mom aware of OP/BFSG and LC phone line assistance after D/C.  Maternal Data    Feeding Feeding Type: Bottle Fed - Formula  LATCH Score/Interventions                      Lactation Tools Discussed/Used     Consult Status Consult Status: PRN    Geralynn OchsWILLIARD, Johnella 10/31/2015, 10:27 AM

## 2015-10-31 NOTE — Lactation Note (Signed)
This note was copied from a baby's chart. Lactation Consultation Note  Patient Name: Kimberly [name] ZOXWR'UToday's Date: 10/31/2015    Baby 46 hours old. Mom decided to rent a hospital-grade pump for 2 weeks since she will not have her personal pump for a few days.  Maternal Data    Feeding    [facility]                      Lactation Tools Discussed/Used     Consult Status      Geralynn OchsWILLIARD, Sesilia 10/31/2015, 2:37 PM

## 2015-11-02 LAB — TYPE AND SCREEN
ABO/RH(D): A NEG
ANTIBODY SCREEN: POSITIVE
DAT, IgG: NEGATIVE
Unit division: 0
Unit division: 0

## 2015-11-07 ENCOUNTER — Encounter: Payer: BC Managed Care – PPO | Admitting: Certified Nurse Midwife

## 2015-11-12 ENCOUNTER — Telehealth: Payer: Self-pay | Admitting: *Deleted

## 2015-11-12 NOTE — Telephone Encounter (Signed)
Patient is calling to let us know about her clotting. 10:5 Call to patient- patient state she spoke to on call about a clot she passed yesterday. She had passed some small clots Saturday that really didn't amount to any thing- but yesterday she passed a pretty large one that was fairly significant in size. She has not passed any since. She has a little right sided pain, Patient has no other complaints. She is 2 weeks out from delivery and has stopped breast feeding. She is still bleeding - but nothing that is any heavier than a cycle at this time. She has an appointment tomorrow with Boykin Reaperachelle.

## 2015-11-13 ENCOUNTER — Encounter: Payer: Self-pay | Admitting: Certified Nurse Midwife

## 2015-11-13 ENCOUNTER — Ambulatory Visit (INDEPENDENT_AMBULATORY_CARE_PROVIDER_SITE_OTHER): Payer: BC Managed Care – PPO | Admitting: Certified Nurse Midwife

## 2015-11-13 ENCOUNTER — Other Ambulatory Visit: Payer: Self-pay | Admitting: Certified Nurse Midwife

## 2015-11-13 DIAGNOSIS — R399 Unspecified symptoms and signs involving the genitourinary system: Secondary | ICD-10-CM

## 2015-11-13 DIAGNOSIS — F411 Generalized anxiety disorder: Secondary | ICD-10-CM

## 2015-11-13 DIAGNOSIS — Z30011 Encounter for initial prescription of contraceptive pills: Secondary | ICD-10-CM

## 2015-11-13 LAB — POCT URINALYSIS DIPSTICK
BILIRUBIN UA: NEGATIVE
Glucose, UA: NEGATIVE
Ketones, UA: NEGATIVE
NITRITE UA: NEGATIVE
PH UA: 6
Protein, UA: NEGATIVE
Spec Grav, UA: 1.015
UROBILINOGEN UA: NEGATIVE

## 2015-11-13 MED ORDER — NITROFURANTOIN MONOHYD MACRO 100 MG PO CAPS: 100.0000 mg | ORAL_CAPSULE | Freq: Two times a day (BID) | ORAL | Status: AC

## 2015-11-13 MED ORDER — ESCITALOPRAM OXALATE 10 MG PO TABS: 10.0000 mg | ORAL_TABLET | Freq: Every day | ORAL | Status: DC

## 2015-11-13 MED ORDER — NORETHIN-ETH ESTRAD-FE BIPHAS 1 MG-10 MCG / 10 MCG PO TABS: 1.0000 | ORAL_TABLET | Freq: Every day | ORAL | Status: DC

## 2015-11-13 NOTE — Progress Notes (Signed)
Patient ID: Kimberly DillsJennifer E Martinezlopez, female   DOB: 1985-10-09, 30 y.o.   MRN: [id number]  Subjective:     Kimberly DillsJennifer E Sumlin is a 30 y.o. female who presents for a postpartum visit. She is 2 weeks postpartum following a spontaneous vaginal delivery. I have fully reviewed the prenatal and intrapartum course. The delivery was at 40 gestational weeks. Outcome: spontaneous vaginal delivery. Anesthesia: epidural. Postpartum course has been normal. Baby's course has been normal. Baby is feeding by bottle - Neutramagen. Bleeding thin lochia, brown and red. Bowel function is normal. Bladder function is normal. Patient is not sexually active. Contraception method is abstinence. Postpartum depression screening: negative.  Tobacco, alcohol and substance abuse history reviewed.  Adult immunizations reviewed including TDAP, rubella and varicella.  The following portions of the patient's history were reviewed and updated as appropriate: allergies, current medications, past family history, past medical history, past social history, past surgical history and problem list.  Review of Systems Pertinent items noted in HPI and remainder of comprehensive ROS otherwise negative.   Objective:    BP 117/82 mmHg  Pulse 69  Wt 147 lb (66.679 kg)  LMP 02/08/2015  General:  alert, cooperative and no distress   Breasts:  inspection negative, no nipple discharge or bleeding, no masses or nodularity palpable  Lungs: clear to auscultation bilaterally  Heart:  regular rate and rhythm, S1, S2 normal, no murmur, click, rub or gallop  Abdomen: soft, non-tender; bowel sounds normal; no masses,  no organomegaly   Vulva:  not evaluated  Vagina: not evaluated  Cervix:  not evaluated  Corpus: not examined  Adnexa:  not evaluated  Rectal Exam: Not performed.          50% of 15 min visit spent on counseling and coordination of care.  Assessment:     Normal 2 week postpartum exam. Pap smear not done at today's visit.    Anxiety/Depression: started on Lexapro    Plan:    1. Contraception: abstinence and OCP (estrogen/progesterone) 2.  Is to start birth control pill at least 30 days out from delivery.  Verbalized understanding.  3.  Started on lexapro for anxiety 4. Follow up in: 4 weeks or as needed.  2hr GTT for h/o GDM/screening for DM q 3 yrs per ADA recommendations Preconception counseling provided Healthy lifestyle practices reviewed

## 2015-12-05 ENCOUNTER — Ambulatory Visit (INDEPENDENT_AMBULATORY_CARE_PROVIDER_SITE_OTHER): Payer: BC Managed Care – PPO | Admitting: Certified Nurse Midwife

## 2015-12-05 ENCOUNTER — Encounter: Payer: Self-pay | Admitting: Certified Nurse Midwife

## 2015-12-05 VITALS — BP 118/74 | HR 72 | Wt 133.0 lb

## 2015-12-05 DIAGNOSIS — F419 Anxiety disorder, unspecified: Secondary | ICD-10-CM

## 2015-12-05 DIAGNOSIS — B3731 Acute candidiasis of vulva and vagina: Secondary | ICD-10-CM

## 2015-12-05 DIAGNOSIS — O99345 Other mental disorders complicating the puerperium: Secondary | ICD-10-CM

## 2015-12-05 DIAGNOSIS — B373 Candidiasis of vulva and vagina: Secondary | ICD-10-CM

## 2015-12-05 DIAGNOSIS — F53 Postpartum depression: Secondary | ICD-10-CM

## 2015-12-05 DIAGNOSIS — O901 Disruption of perineal obstetric wound: Secondary | ICD-10-CM | POA: Insufficient documentation

## 2015-12-05 LAB — POCT URINALYSIS DIPSTICK
BILIRUBIN UA: NEGATIVE
Blood, UA: NEGATIVE
GLUCOSE UA: NEGATIVE
Ketones, UA: NEGATIVE
NITRITE UA: NEGATIVE
PH UA: 6
Protein, UA: NEGATIVE
Spec Grav, UA: 1.015
UROBILINOGEN UA: NEGATIVE

## 2015-12-05 MED ORDER — GABAPENTIN 100 MG PO CAPS: 100.0000 mg | ORAL_CAPSULE | Freq: Every day | ORAL | Status: DC

## 2015-12-05 MED ORDER — FLUCONAZOLE 200 MG PO TABS: 200.0000 mg | ORAL_TABLET | Freq: Once | ORAL | Status: DC

## 2015-12-05 MED ORDER — DESVENLAFAXINE SUCCINATE ER 100 MG PO TB24: 100.0000 mg | ORAL_TABLET | Freq: Every day | ORAL | Status: DC

## 2015-12-05 MED ORDER — ALPRAZOLAM 0.5 MG PO TABS: 0.5000 mg | ORAL_TABLET | Freq: Every evening | ORAL | Status: DC | PRN

## 2015-12-05 NOTE — Progress Notes (Signed)
Patient ID: Kimberly [name], female   DOB: 06/26/1986, 30 y.o.   MRN: [id number]   Chief Complaint  Patient presents with  . Postpartum Care    HPI Kimberly [name] is a 30 y.o. female.  Here with vaginal itching, and slight bleeding when she wipes.  States that her bottom is really sore.  She also wants to start back on her previous medications for depression/anxiety now that she is not pregnant.  Was previously on Gabapentin, Pristique and Xanax.  Is bottle feeding.  States that when she takes her colace that she is not constipated. Encouraged sitz baths with Epson salts.  Patient verbalized understanding.  States that she has an appointment with her psychiatrist next week, desires to get started back on her medications today.     HPI  Past Medical History  Diagnosis Date  . Anxiety   . Palpitations   . Chest pain   . Mononucleosis     Past Surgical History  Procedure Laterality Date  . Nasal sinus surgery    . Tonsillectomy and adenoidectomy    . Adenoidectomy      Family History  Problem Relation Age of Onset  . Asthma Mother   . Thyroid disease Father   . Depression Father   . Anxiety disorder Father   . Anxiety disorder Sister   . Other Father     B12 DEFICIENCY  . Arrhythmia Maternal Grandmother     AFIB  . Stroke Maternal Grandmother   . Cancer Paternal Grandfather     COLON  . Heart Problems Maternal Grandmother     PACER    Social History Social History  Substance Use Topics  . Smoking status: Never Smoker   . Smokeless tobacco: Never Used  . Alcohol Use: No    Allergies  Allergen Reactions  . Augmentin [Amoxicillin-Pot Clavulanate] Nausea And Vomiting  . Sulfa Antibiotics Nausea And Vomiting  . Codeine Nausea And Vomiting    Current Outpatient Prescriptions  Medication Sig Dispense Refill  . ibuprofen (ADVIL,MOTRIN) 600 MG tablet Take 1 tablet (600 mg total) by mouth every 6 (six) hours. 90 tablet 4  . Iron-FA-B Cmp-C-Biot-Probiotic (FUSION  PLUS) CAPS Take 1 tablet by mouth daily. 30 capsule 12  . omeprazole (PRILOSEC) 20 MG capsule Take 1 capsule (20 mg total) by mouth 2 (two) times daily before a meal. (Patient taking differently: Take 20 mg by mouth 3 (three) times daily as needed (indigestion). ) 60 capsule 5  . Phenylephrine HCl (AFRIN ALLERGY NA) Place into the nose as needed.    . Prenatal Vit-Fe Phos-FA-Omega (VITAFOL GUMMIES) 3.33-0.333-34.8 MG CHEW Chew 3 tablets by mouth daily. 90 tablet 12  . senna-docusate (SENOKOT-S) 8.6-50 MG tablet Take 2 tablets by mouth daily. 90 tablet PRN  . ALPRAZolam (XANAX) 0.5 MG tablet Take 1 tablet (0.5 mg total) by mouth at bedtime as needed for anxiety. 30 tablet 0  . desvenlafaxine (PRISTIQ) 100 MG 24 hr tablet Take 1 tablet (100 mg total) by mouth daily. 30 tablet 3  . escitalopram (LEXAPRO) 10 MG tablet Take 1 tablet (10 mg total) by mouth daily. (Patient not taking: Reported on 12/05/2015) 30 tablet 6  . fluconazole (DIFLUCAN) 200 MG tablet Take 1 tablet (200 mg total) by mouth once. Repeat dose in 48-72 hours. 3 tablet o  . gabapentin (NEURONTIN) 100 MG capsule Take 1 capsule (100 mg total) by mouth at bedtime. 30 capsule 2  . Norethindrone-Ethinyl Estradiol-Fe Biphas (LO LOESTRIN FE) 1 MG-10  MCG / 10 MCG tablet Take 1 tablet by mouth daily. Take 1 tablet by mouth daily. (Patient not taking: Reported on 12/05/2015) 3 Package 4   No current facility-administered medications for this visit.    Review of Systems Review of Systems Constitutional: negative for fatigue and weight loss Respiratory: negative for cough and wheezing Cardiovascular: negative for chest pain, fatigue and palpitations Gastrointestinal: negative for abdominal pain and change in bowel habits Genitourinary: + vaginal irritation/spotting Integument/breast: negative for nipple discharge Musculoskeletal:negative for myalgias Neurological: negative for gait problems and tremors Behavioral/Psych: negative for abusive  relationship, + depression/anxiety Endocrine: negative for temperature intolerance     Blood pressure 118/74, pulse 72, weight 133 lb (60.328 kg), unknown if currently breastfeeding.  Physical Exam Physical Exam General:   alert  Skin:   no rash or abnormalities  Lungs:   clear to auscultation bilaterally  Heart:   regular rate and rhythm, S1, S2 normal, no murmur, click, rub or gallop  Breasts:   deferred  Abdomen:  normal findings: no organomegaly, soft, non-tender and no hernia  Pelvis:  External genitalia: normal general appearance, sutures inside introitus, not dissolved, small area about 2-3 mm of excoriation on right side of episiotomy towards rectum, no pus, no induration, non-tender.   Urinary system: urethral meatus normal and bladder without fullness, nontender Vaginal: normal without tenderness, induration or masses     50% of 15 min visit spent on counseling and coordination of care.   Data Reviewed Previous medical hx, meds, labs  Assessment     Depression/anxiety Delayed healing of episiotomy sutures Constipation Vulvovaginal candidiasis     Plan    Orders Placed This Encounter  Procedures  . POCT urinalysis dipstick   Meds ordered this encounter  Medications  . ALPRAZolam (XANAX) 0.5 MG tablet    Sig: Take 1 tablet (0.5 mg total) by mouth at bedtime as needed for anxiety.    Dispense:  30 tablet    Refill:  0  . gabapentin (NEURONTIN) 100 MG capsule    Sig: Take 1 capsule (100 mg total) by mouth at bedtime.    Dispense:  30 capsule    Refill:  2  . desvenlafaxine (PRISTIQ) 100 MG 24 hr tablet    Sig: Take 1 tablet (100 mg total) by mouth daily.    Dispense:  30 tablet    Refill:  3  . fluconazole (DIFLUCAN) 200 MG tablet    Sig: Take 1 tablet (200 mg total) by mouth once. Repeat dose in 48-72 hours.    Dispense:  3 tablet    Refill:  o    Possible management options include: suture removal Follow up next week for suture evaluation.

## 2015-12-08 LAB — NUSWAB VG, CANDIDA 6SP
CANDIDA ALBICANS, NAA: NEGATIVE
CANDIDA GLABRATA, NAA: NEGATIVE
CANDIDA PARAPSILOSIS, NAA: NEGATIVE
CANDIDA TROPICALIS, NAA: NEGATIVE
Candida krusei, NAA: NEGATIVE
Candida lusitaniae, NAA: NEGATIVE
Trich vag by NAA: NEGATIVE

## 2015-12-11 ENCOUNTER — Ambulatory Visit: Payer: BC Managed Care – PPO | Admitting: Certified Nurse Midwife

## 2015-12-13 ENCOUNTER — Ambulatory Visit: Payer: BC Managed Care – PPO | Admitting: Certified Nurse Midwife

## 2015-12-14 ENCOUNTER — Ambulatory Visit: Payer: BC Managed Care – PPO | Admitting: Certified Nurse Midwife

## 2015-12-18 ENCOUNTER — Ambulatory Visit: Payer: BC Managed Care – PPO | Admitting: Certified Nurse Midwife

## 2015-12-19 ENCOUNTER — Ambulatory Visit (INDEPENDENT_AMBULATORY_CARE_PROVIDER_SITE_OTHER): Payer: BC Managed Care – PPO | Admitting: Certified Nurse Midwife

## 2015-12-19 ENCOUNTER — Encounter: Payer: Self-pay | Admitting: Certified Nurse Midwife

## 2015-12-19 LAB — POCT URINALYSIS DIPSTICK
Bilirubin, UA: NEGATIVE
Blood, UA: NEGATIVE
GLUCOSE UA: NEGATIVE
Ketones, UA: NEGATIVE
NITRITE UA: NEGATIVE
PROTEIN UA: NEGATIVE
Spec Grav, UA: 1.015
UROBILINOGEN UA: NEGATIVE
pH, UA: 6

## 2015-12-19 NOTE — Progress Notes (Signed)
Patient ID: Theo DillsJennifer E Crysler, female   DOB: 12/02/1985, 30 y.o.   MRN: [id number]  Subjective:     Theo DillsJennifer E Harten is a 30 y.o. female who presents for a postpartum visit. She is 6 weeks postpartum following a spontaneous vaginal delivery. I have fully reviewed the prenatal and intrapartum course. The delivery was at 40 gestational weeks. Outcome: spontaneous vaginal delivery. Anesthesia: epidural. Postpartum course has been normal. Baby's course has been normal. Baby is feeding by bottle - Similac Sensitive RS. Bleeding having her first period since delivery. Bowel function is normal. Bladder function is normal. Patient is not sexually active. Contraception method is OCP (estrogen/progesterone). Postpartum depression screening: negative; score of 3.  Tobacco, alcohol and substance abuse history reviewed.  Adult immunizations reviewed including TDAP, rubella and varicella.  The following portions of the patient's history were reviewed and updated as appropriate: allergies, current medications, past family history, past medical history, past social history, past surgical history and problem list.  Review of Systems Pertinent items noted in HPI and remainder of comprehensive ROS otherwise negative.   Objective:    BP 93/59 mmHg  Pulse 73  Wt 149 lb (67.586 kg)  General:  alert, cooperative and no distress   Breasts:  inspection negative, no nipple discharge or bleeding, no masses or nodularity palpable  Lungs: clear to auscultation bilaterally  Heart:  regular rate and rhythm, S1, S2 normal, no murmur, click, rub or gallop  Abdomen: soft, non-tender; bowel sounds normal; no masses,  no organomegaly   Vulva:  normal  Vagina: normal vagina  Cervix:  no cervical motion tenderness  Corpus: normal size, contour, position, consistency, mobility, non-tender  Adnexa:  normal adnexa  Rectal Exam: Not performed.          50% of 20 min visit spent on counseling and coordination of care.  Assessment:      Normal 6 week postpartum exam. Pap smear not done at today's visit.     Depression/Anxiety: stable   Plan:    1. Contraception: OCP (estrogen/progesterone) 2.  Discuss with Engineer, manufacturingractice manager about [employer] bill.  Start on OCPs.  Depression stable with medications.  3. Follow up in: 1 year or when due for annual exam or as needed.  2hr GTT for h/o GDM/screening for DM q 3 yrs per ADA recommendations Preconception counseling provided Healthy lifestyle practices reviewed

## 2016-06-09 NOTE — Progress Notes (Addendum)
Psychiatric Initial Adult Assessment   Patient Identification: Kimberly Kimberly [name] MRN:  [id number] Date of Evaluation:  06/11/2016 Referral Source: self Chief Complaint:   Chief Complaint    Anxiety; New Evaluation     Visit Diagnosis:    ICD-9-CM ICD-10-CM   1. Obsessive-compulsive disorder, unspecified type 300.3 F42.9     History of Present Illness:   Kimberly Kimberly [name] is a 30 year old 387 months postpartum female with depression, anxiety, OCD who presents for anxiety.   Patient states that she loves Kimberly Kimberly [name], her daughter so much that she is worried a lot about her. She is constantly worried about germ, and washes her bottle to be "super clean." She washes the bottle a certain number of times. She tends to wash hands "significant amount of times" so that she would not touch Kimberly [name] with germs. Although she feels it is not rational to do those things, she cannot help doing it. She sometimes misses to go to appointment on time, as she tried to clean things up. She reports discordance with her mother who had open heart surgery in October. She reports discordance with her husband as well, although it gets better for the past few months. 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   She denies insomnia. She reports feeling tired and occasionally feels depressed. She is able to take care of Kimberly [name]. She denies decreased need for sleep. She has occasional panic attack. She denies SI/HI. She denies AH/VH. She denies alcohol use or drug use. She states that although she has tried to take Prestiq, it causes her diarrhea and nausea. She felt drowsy after taking Xanax.   Associated Signs/Symptoms: Depression Symptoms:  depressed mood, fatigue, anxiety, panic attacks, (Hypo) Manic Symptoms:  denies Anxiety Symptoms:  Panic Symptoms, anxiety Psychotic Symptoms:  denies PTSD Symptoms: Negative  Past Psychiatric History:   Outpatient: Dx: anxiety,  depression, OCD since 2007, she has seen MH provider on and off. [name] Psychiatry admission: denies Previous suicide attempt: denies Past trials of medication: tried couple of medication, Prestiq 100 mg (diarrhea, nausea), Xanax History of violence: denies  Previous Psychotropic Medications: Yes   Substance Abuse History in the last 12 months:  No.  Consequences of Substance Abuse: NA  Past Medical History:  Past Medical History:  Diagnosis Date  . Anxiety   . Chest pain   . Mononucleosis   . Palpitations     Past Surgical History:  Procedure Laterality Date  . ADENOIDECTOMY    . NASAL SINUS SURGERY    . TONSILLECTOMY AND ADENOIDECTOMY      Family Psychiatric History:  Father- anxiety, depression, sister- anxiety, depression, paternal grandfather's brother attempted suicide  Family History:  Family History  Problem Relation Age of Onset  . Asthma Mother   . Thyroid disease Father   . Depression Father   . Anxiety disorder Father   . Anxiety disorder Sister   . Other Father     B12 DEFICIENCY  . Arrhythmia Maternal Grandmother     AFIB  . Stroke Maternal Grandmother   . Cancer Paternal Grandfather     COLON  . Heart Problems Maternal Grandmother     PACER    Social History:   Social History   Social History  . Marital status: Single    Spouse name: N/A  . Number of children: N/A  . Years of  education: N/A   Social History Main Topics  . Smoking status: Never Smoker  . Smokeless tobacco: Never Used  . Alcohol use No  . Drug use: No  . Sexual activity: Yes    Birth control/ protection: None   Other Topics Concern  . None   Social History Narrative  . 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 Augmentin [Amoxicillin-Pot Clavulanate] Nausea And Vomiting  . Sulfa Antibiotics Nausea And Vomiting  . Codeine Nausea And Vomiting    Metabolic Disorder Labs: No results found for: HGBA1C, MPG No results found for: PROLACTIN No results found for: CHOL, TRIG, HDL, CHOLHDL, VLDL, LDLCALC   Current Medications: Current Outpatient Prescriptions  Medication Sig Dispense Refill  . fluconazole (DIFLUCAN) 200 MG tablet Take 1 tablet (200 mg total) by mouth once. Repeat dose in 48-72 hours. 3 tablet o  . gabapentin (NEURONTIN) 100 MG capsule Take 1 capsule (100 mg total) by mouth at bedtime. (Patient not taking: Reported on 12/19/2015) 30 capsule 2  . ibuprofen (ADVIL,MOTRIN) 600 MG tablet Take 1 tablet (600 mg total) by mouth every 6 (six) hours. 90 tablet 4  . Iron-FA-B Cmp-C-Biot-Probiotic (FUSION PLUS) CAPS Take 1 tablet by mouth daily. (Patient not taking: Reported on 12/19/2015) 30 capsule 12  . Norethindrone-Ethinyl Estradiol-Fe Biphas (LO LOESTRIN FE) 1 MG-10 MCG / 10 MCG tablet Take 1 tablet by mouth daily. Take 1 tablet by mouth daily. (Patient not taking: Reported on 12/05/2015) 3 Package 4  . omeprazole (PRILOSEC) 20 MG capsule Take 1 capsule (20 mg total) by mouth 2 (two) times daily before a meal. (Patient not taking: Reported on 12/19/2015) 60 capsule 5  . Phenylephrine HCl (AFRIN ALLERGY NA) Place into the nose as needed.    . Prenatal Vit-Fe Phos-FA-Omega (VITAFOL GUMMIES) 3.33-0.333-34.8 MG CHEW Chew 3 tablets by mouth daily. 90 tablet 12  . senna-docusate (SENOKOT-S) 8.6-50 MG tablet Take 2 tablets by mouth daily. 90 tablet PRN  . sertraline (ZOLOFT) 25 MG tablet 25 mg daily for two weeks, then 50 mg daily 60 tablet 1   No current facility-administered medications for this visit.     Neurologic: Headache: No Seizure: No Paresthesias:No  Musculoskeletal: Strength & Muscle Tone: within normal limits Gait & Station:  normal Patient leans: N/A  Psychiatric Specialty Exam: Review of Systems  Psychiatric/Behavioral: Positive for depression. Negative for hallucinations, substance abuse and suicidal ideas. The patient is nervous/anxious. The patient does not have insomnia.   All other systems reviewed and are negative.   Blood pressure 118/68, pulse 77, height 5\' 3"  (1.6 m), weight 139 lb 6.4 oz (63.2 kg), unknown if currently breastfeeding.Body mass index is 24.69 kg/m.  General Appearance: Casual  Eye Contact:  Good  Speech:  Clear and Coherent  Volume:  Normal  Mood:  Anxious  Affect:  Congruent  Thought Process:  Coherent and Goal Directed  Orientation:  Full (Time, Place, and Person)  Thought Content:  Logical Perceptions: denies AH/VH  Suicidal Thoughts:  No  Homicidal Thoughts:  No  Memory:  Immediate;   Good Recent;   Good Remote;   Good  Judgement:  Good  Insight:  Good  Psychomotor Activity:  Normal  Concentration:  Concentration: Good and Attention Span: Good  Recall:  Good  Fund of Knowledge:Good  Language: Good  Akathisia:  NA  Handed:  Right  AIMS (if indicated):  N/A  Assets:  Communication Skills Desire for Improvement  ADL's:  Intact  Cognition: WNL  Sleep:  good   Assessment VENORA KAUTZMAN is a 30 year old, 7 months postpartum female with depression, anxiety, OCD who presents for anxiety. Psychosocial stressors including marital, familial discordance and unemployment after delivery.   # OCD with good insight # R/o OCD Patient endorses repetitive behaviors and mental acts, which are consistent with OCD. Will start sertraline to target her mood (patient does not breastfeed anymore). Discussed side effects which includes nausea and vomiting. Patient is encouraged to continue to see her therapist.    Plan 1. Start sertraline 25 mg daily for two weeks, then 50 mg daily 2. Disocntinue Prestiq, Xanax 3. Return to clinic in January  The patient demonstrates the following  risk factors for suicide: Chronic risk factors for suicide include: psychiatric disorder of anxiety. Acute risk factors for suicide include: family or marital conflict, unemployment and loss (financial, interpersonal, professional). Protective factors for this patient include: positive social support, responsibility to others (children, family), coping skills and hope for the future. Considering these factors, the overall suicide risk at this point appears to be low. Patient is appropriate for outpatient follow up.  Treatment Plan Summary: Plan as above   Neysa Hotter, MD 12/6/20172:57 PM

## 2016-06-11 ENCOUNTER — Ambulatory Visit (INDEPENDENT_AMBULATORY_CARE_PROVIDER_SITE_OTHER): Payer: Self-pay | Admitting: Psychiatry

## 2016-06-11 ENCOUNTER — Encounter (HOSPITAL_COMMUNITY): Payer: Self-pay | Admitting: Psychiatry

## 2016-06-11 VITALS — BP 118/68 | HR 77 | Ht 63.0 in | Wt 139.4 lb

## 2016-06-11 DIAGNOSIS — Z823 Family history of stroke: Secondary | ICD-10-CM

## 2016-06-11 DIAGNOSIS — Z8349 Family history of other endocrine, nutritional and metabolic diseases: Secondary | ICD-10-CM

## 2016-06-11 DIAGNOSIS — Z833 Family history of diabetes mellitus: Secondary | ICD-10-CM

## 2016-06-11 DIAGNOSIS — Z79899 Other long term (current) drug therapy: Secondary | ICD-10-CM

## 2016-06-11 DIAGNOSIS — Z825 Family history of asthma and other chronic lower respiratory diseases: Secondary | ICD-10-CM

## 2016-06-11 DIAGNOSIS — Z882 Allergy status to sulfonamides status: Secondary | ICD-10-CM

## 2016-06-11 DIAGNOSIS — F429 Obsessive-compulsive disorder, unspecified: Secondary | ICD-10-CM

## 2016-06-11 DIAGNOSIS — Z888 Allergy status to other drugs, medicaments and biological substances status: Secondary | ICD-10-CM

## 2016-06-11 DIAGNOSIS — Z8249 Family history of ischemic heart disease and other diseases of the circulatory system: Secondary | ICD-10-CM

## 2016-06-11 MED ORDER — SERTRALINE HCL 25 MG PO TABS: ORAL_TABLET | ORAL | 1 refills | Status: DC

## 2016-06-11 NOTE — Patient Instructions (Addendum)
1. Start sertraline 25 mg daily for two weeks, then 50 mg daily 2. Return to clinic in January 

## 2016-08-05 NOTE — Progress Notes (Deleted)
BH MD/PA/NP OP Progress Note  08/05/2016 9:24 AM KIRBY ARGUETA  MRN:  [id number]  Chief Complaint:  Subjective:  *** HPI: *** Visit Diagnosis: No diagnosis found.  Past Psychiatric History:  Outpatient: Dx: anxiety, depression, OCD since 2007, she has seen MH provider on and off. Currently sees Ms.Anibal Henderson at [facility] weekly Psychiatry admission: denies Previous suicide attempt: denies Past trials of medication: tried couple of medication, Prestiq 100 mg (diarrhea, nausea), Xanax History of violence: denies  Past Medical History:  Past Medical History:  Diagnosis Date  . Anxiety   . Chest pain   . Mononucleosis   . Palpitations     Past Surgical History:  Procedure Laterality Date  . ADENOIDECTOMY    . NASAL SINUS SURGERY    . TONSILLECTOMY AND ADENOIDECTOMY      Family Psychiatric History:  Father- anxiety, depression, sister- anxiety, depression, paternal grandfather's brother attempted suicide  Family History:  Family History  Problem Relation Age of Onset  . Asthma Mother   . Thyroid disease Father   . Depression Father   . Anxiety disorder Father   . Anxiety disorder Sister   . Other Father     B12 DEFICIENCY  . Arrhythmia Maternal Grandmother     AFIB  . Stroke Maternal Grandmother   . Cancer Paternal Grandfather     COLON  . Heart Problems Maternal Grandmother     PACER    Social History:  Social History   Social History  . Marital status: Single    Spouse name: N/A  . Number of children: N/A  . Years of education: N/A   Social History Main Topics  . Smoking status: Never Smoker  . Smokeless tobacco: Never Used  . Alcohol use No  . Drug use: No  . Sexual activity: Yes    Birth control/ protection: None   Other Topics Concern  . Not on file   Social History Narrative  . No narrative on file    Allergies:  Allergies  Allergen Reactions  . Augmentin [Amoxicillin-Pot Clavulanate] Nausea And Vomiting  . Sulfa  Antibiotics Nausea And Vomiting  . Codeine Nausea And Vomiting    Metabolic Disorder Labs: No results found for: HGBA1C, MPG No results found for: PROLACTIN No results found for: CHOL, TRIG, HDL, CHOLHDL, VLDL, LDLCALC   Current Medications: Current Outpatient Prescriptions  Medication Sig Dispense Refill  . fluconazole (DIFLUCAN) 200 MG tablet Take 1 tablet (200 mg total) by mouth once. Repeat dose in 48-72 hours. 3 tablet o  . gabapentin (NEURONTIN) 100 MG capsule Take 1 capsule (100 mg total) by mouth at bedtime. (Patient not taking: Reported on 12/19/2015) 30 capsule 2  . ibuprofen (ADVIL,MOTRIN) 600 MG tablet Take 1 tablet (600 mg total) by mouth every 6 (six) hours. 90 tablet 4  . Iron-FA-B Cmp-C-Biot-Probiotic (FUSION PLUS) CAPS Take 1 tablet by mouth daily. (Patient not taking: Reported on 12/19/2015) 30 capsule 12  . Norethindrone-Ethinyl Estradiol-Fe Biphas (LO LOESTRIN FE) 1 MG-10 MCG / 10 MCG tablet Take 1 tablet by mouth daily. Take 1 tablet by mouth daily. (Patient not taking: Reported on 12/05/2015) 3 Package 4  . omeprazole (PRILOSEC) 20 MG capsule Take 1 capsule (20 mg total) by mouth 2 (two) times daily before a meal. (Patient not taking: Reported on 12/19/2015) 60 capsule 5  . Phenylephrine HCl (AFRIN ALLERGY NA) Place into the nose as needed.    . Prenatal Vit-Fe Phos-FA-Omega (VITAFOL GUMMIES) 3.33-0.333-34.8 MG CHEW Chew 3  tablets by mouth daily. 90 tablet 12  . senna-docusate (SENOKOT-S) 8.6-50 MG tablet Take 2 tablets by mouth daily. 90 tablet PRN  . sertraline (ZOLOFT) 25 MG tablet 25 mg daily for two weeks, then 50 mg daily 60 tablet 1   No current facility-administered medications for this visit.     Neurologic: Headache: No Seizure: No Paresthesias: No  Musculoskeletal: Strength & Muscle Tone: within normal limits Gait & Station: normal Patient leans: N/A  Psychiatric Specialty Exam: ROS  unknown if currently breastfeeding.There is no height or weight on  file to calculate BMI.  General Appearance: Fairly Groomed  Eye Contact:  Good  Speech:  Clear and Coherent  Volume:  Normal  Mood:  {BHH MOOD:22306}  Affect:  {Affect (PAA):22687}  Thought Process:  Coherent and Goal Directed  Orientation:  Full (Time, Place, and Person)  Thought Content: Logical   Suicidal Thoughts:  {ST/HT (PAA):22692}  Homicidal Thoughts:  {ST/HT (PAA):22692}  Memory:  Immediate;   Good Recent;   Good Remote;   Good  Judgement:  {Judgement (PAA):22694}  Insight:  {Insight (PAA):22695}  Psychomotor Activity:  Normal  Concentration:  Concentration: Good and Attention Span: Good  Recall:  Good  Fund of Knowledge: Good  Language: Good  Akathisia:  No  Handed:  Right  AIMS (if indicated):  N/A  Assets:  Communication Skills Desire for Improvement  ADL's:  Intact  Cognition: WNL  Sleep:  ***   Assessment Theo DillsJennifer E Gowdy is a [age] year old, 7 months postpartum female with depression, anxiety, OCD who presents for anxiety. Psychosocial stressors including marital, familial discordance and unemployment after delivery.   # OCD with good insight # R/o OCD Patient endorses repetitive behaviors and mental acts, which are consistent with OCD. Will start sertraline to target her mood (patient does not breastfeed anymore). Discussed side effects which includes nausea and vomiting. Patient is encouraged to continue to see her therapist.    Plan 1. Start sertraline 25 mg daily for two weeks, then 50 mg daily 2. Disocntinue Prestiq, Xanax 3. Return to clinic in January  The patient demonstrates the following risk factors for suicide: Chronic risk factors for suicide include: psychiatric disorder of anxiety. Acute risk factors for suicide include: family or marital conflict, unemployment and loss (financial, interpersonal, professional). Protective factors for this patient include: positive social support, responsibility to others (children, family), coping skills and  hope for the future. Considering these factors, the overall suicide risk at this point appears to be low. Patient is appropriate for outpatient follow up.  Treatment Plan Summary: Plan as above   Neysa Hottereina Lajune Perine, MD 08/05/2016, 9:24 AM

## 2016-08-06 ENCOUNTER — Ambulatory Visit (HOSPITAL_COMMUNITY): Payer: Self-pay | Admitting: Psychiatry

## 2017-05-20 ENCOUNTER — Ambulatory Visit: Payer: BLUE CROSS/BLUE SHIELD | Admitting: Maternal Newborn

## 2017-05-20 ENCOUNTER — Encounter: Payer: Self-pay | Admitting: Maternal Newborn

## 2017-05-20 VITALS — BP 110/70 | HR 84 | Ht 63.0 in | Wt 134.0 lb

## 2017-05-20 DIAGNOSIS — B373 Candidiasis of vulva and vagina: Secondary | ICD-10-CM | POA: Diagnosis not present

## 2017-05-20 DIAGNOSIS — N390 Urinary tract infection, site not specified: Secondary | ICD-10-CM | POA: Diagnosis not present

## 2017-05-20 DIAGNOSIS — R300 Dysuria: Secondary | ICD-10-CM | POA: Diagnosis not present

## 2017-05-20 DIAGNOSIS — Z30011 Encounter for initial prescription of contraceptive pills: Secondary | ICD-10-CM

## 2017-05-20 DIAGNOSIS — B3731 Acute candidiasis of vulva and vagina: Secondary | ICD-10-CM

## 2017-05-20 LAB — POCT URINALYSIS DIPSTICK
BILIRUBIN UA: NEGATIVE
Blood, UA: NEGATIVE
Glucose, UA: NEGATIVE
Ketones, UA: NEGATIVE
NITRITE UA: NEGATIVE
UROBILINOGEN UA: NEGATIVE U/dL — AB
pH, UA: 6 (ref 5.0–8.0)

## 2017-05-20 LAB — POCT WET PREP (WET MOUNT)
CLUE CELLS WET PREP WHIFF POC: NEGATIVE
TRICHOMONAS WET PREP HPF POC: ABSENT

## 2017-05-20 MED ORDER — CIPROFLOXACIN HCL 250 MG PO TABS: 250.0000 mg | ORAL_TABLET | Freq: Two times a day (BID) | ORAL | 0 refills | Status: AC

## 2017-05-20 MED ORDER — LESSINA 0.1-20 MG-MCG PO TABS: 1.0000 | ORAL_TABLET | Freq: Every day | ORAL | 7 refills | Status: AC

## 2017-05-20 MED ORDER — TERCONAZOLE 0.4 % VA CREA: 1.0000 | TOPICAL_CREAM | Freq: Every day | VAGINAL | 1 refills | Status: AC

## 2017-05-20 NOTE — Progress Notes (Signed)
Obstetrics & Gynecology Office Visit   Chief Complaint:  Chief Complaint  Patient presents with  . Gynecologic Exam    YEAST INF; MAYBE UTI; RX FOR BCP SHE WAS ON BEFORE PREG    History of Present Illness: [name] is a 31 y.o. G1P1001 whose LMP was 05/11/2017, presents today for a problem visit. Patient complains of an abnormal vaginal discharge for a few weeks. Discharge described as: white, thick and curd-like. Vaginal symptoms include local irritation. Vulvar symptoms include burning, local irritation and vulvar itching. STI Risk: Very low risk of STD exposure. Other associated symptoms: urinary symptoms of dysuria. Menstrual pattern: She had been bleeding regularly. Contraception: condoms.  She denies recent antibiotic exposure, denies changes in soaps and detergents coinciding with the onset of her symptoms.  She has previously self treated for these symptoms with over the counter medication, which improved the symptoms for a few days, but they returned about a week later.    Review of Systems: 10 point review of systems negative unless otherwise noted in HPI.  Past Medical History:  Past Medical History:  Diagnosis Date  . Anxiety   . Chest pain   . Mononucleosis   . Palpitations     Past Surgical History:  Past Surgical History:  Procedure Laterality Date  . ADENOIDECTOMY    . NASAL SINUS SURGERY    . TONSILLECTOMY AND ADENOIDECTOMY    . WISDOM TOOTH EXTRACTION  03/2017; 2013   ONE EACH DATE    Gynecologic History: Patient's last menstrual period was 05/11/2017.  Obstetric History: G1P1001  Family History:  Family History  Problem Relation Age of Onset  . Asthma Mother   . Heart Problems Mother   . Thyroid disease Father   . Depression Father   . Anxiety disorder Father   . Other Father        B12 DEFICIENCY  . Arrhythmia Maternal Grandmother        AFIB  . Stroke Maternal Grandmother   . Heart Problems Maternal Grandmother        PACER  .  Cancer Paternal Grandfather        COLON  . Anxiety disorder Sister     Social History:  Social History   Socioeconomic History  . Marital status: Single    Spouse name: Not on file  . Number of children: Not on file  . Years of education: Not on file  . Highest education level: Not on file  Social Needs  . Financial resource strain: Not on file  . Food insecurity - worry: Not on file  . Food insecurity - inability: Not on file  . Transportation needs - medical: Not on file  . Transportation needs - non-medical: Not on file  Occupational History  . Not on file  Tobacco Use  . Smoking status: Never Smoker  . Smokeless tobacco: Never Used  Substance and Sexual Activity  . Alcohol use: No  . Drug use: No  . Sexual activity: Yes    Birth control/protection: None, Condom  Other Topics Concern  . Not on file  Social History Narrative  . Not on file    Allergies:  Allergies  Allergen Reactions  . Augmentin [Amoxicillin-Pot Clavulanate] Nausea And Vomiting  . Sulfa Antibiotics Nausea And Vomiting  . Codeine Nausea And Vomiting    Medications: Prior to Admission medications   Medication Sig Start Date End Date Taking? Authorizing Provider  ibuprofen (ADVIL,MOTRIN) 600 MG tablet Take 1  tablet (600 mg total) by mouth every 6 (six) hours. 10/31/15   Orvilla Cornwallenney, Rachelle A, CNM  oxymetazoline (NASAL RELIEF) 0.05 % nasal spray Place 1 spray daily into the nose.    [provider]    Physical Exam Vitals:  Vitals:   05/20/17 1518  BP: 110/70  Pulse: 84   Patient's last menstrual period was 05/11/2017.  General: NAD HEENT: normocephalic, anicteric Abdomen: NABS, soft, non-tender, non-distended.  Umbilicus without lesions.   Genitourinary:  External: External female genitalia appear irritated.  Normal urethral meatus, normal Bartholin's and Skene's glands.    Vagina: Vaginal mucosa appears irritated, no evidence of prolapse.    Cervix: Grossly normal in  appearance, no bleeding  Uterus: Non-enlarged, mobile, normal contour.  No CMT  Adnexa: not examined  Rectal: deferred  Lymphatic: no evidence of inguinal lymphadenopathy Extremities: no edema, erythema, or tenderness Neurologic: Grossly intact Psychiatric: mood appropriate, affect full  Wet prep: no clue cells, negative whiff, pH < 4, no trichomonads, moderate yeast. Urinalysis: +1 leukocytes  Assessment: 31 y.o. G1P1001 with symptoms of UTI and vulvovaginal candidiasis, also wishes to begin oral contraceptives.  Plan: Problem List Items Addressed This Visit    None    Visit Diagnoses    Vulvovaginal candidiasis    -  Primary   Relevant Medications   terconazole (TERAZOL 7) 0.4 % vaginal cream   Other Relevant Orders   POCT Wet Prep [facility]([place])   Burning with urination       Relevant Orders   POCT urinalysis dipstick (Completed)   Urine Culture   Urinary tract infection without hematuria, site unspecified       Relevant Medications   ciprofloxacin (CIPRO) 250 MG tablet   Encounter for initial prescription of contraceptive pills       Relevant Medications   LESSINA-28 0.1-20 MG-MCG tablet     1) Wet prep positive for yeast, sent Rx for terconazole and patient advised as to proper use.  2) Sent antibiotic Rx for UTI due to dysuria and +leukocytes on UA. Cipro chosen due to patient's allergies. Urine culture sent and will adjust therapy when results are returned as appropriate.  3) Discussed beginning OCPs, patient prefers COC brand [name] that she was using before her child was born. Discussed side effects and proper use.  A total of 20 minutes were spent in face-to-face contact with the patient during this encounter with over half of that time devoted to counseling and coordination of care.  Return if symptoms worsen or fail to improve.  Marcelyn BruinsJacelyn Beronica Lansdale, CNM 05/20/2017  4:30 PM

## 2017-05-21 ENCOUNTER — Encounter: Payer: BC Managed Care – PPO | Admitting: Advanced Practice Midwife

## 2017-05-22 ENCOUNTER — Encounter: Payer: BC Managed Care – PPO | Admitting: Advanced Practice Midwife

## 2017-05-22 LAB — URINE CULTURE

## 2017-05-27 ENCOUNTER — Other Ambulatory Visit: Payer: Self-pay | Admitting: Maternal Newborn

## 2017-05-27 ENCOUNTER — Telehealth: Payer: Self-pay | Admitting: Advanced Practice Midwife

## 2017-05-27 DIAGNOSIS — B373 Candidiasis of vulva and vagina: Secondary | ICD-10-CM

## 2017-05-27 DIAGNOSIS — B3731 Acute candidiasis of vulva and vagina: Secondary | ICD-10-CM

## 2017-05-27 MED ORDER — FLUCONAZOLE 150 MG PO TABS: 150.0000 mg | ORAL_TABLET | Freq: Once | ORAL | 0 refills | Status: AC

## 2017-05-27 NOTE — Progress Notes (Signed)
Patient still has sx of yeast infection following tx with terconazole. Rx sent for Diflucan.  Marcelyn BruinsJacelyn Schmid, CNM 05/27/2017  12:11 PM

## 2017-05-27 NOTE — Telephone Encounter (Signed)
Patient is still having symptoms of yeast and wants a prescription of the pill.  Pharmacy [facility] [place].

## 2017-07-19 IMAGING — US US MFM OB FOLLOW-UP
1 series · 14 of 28 positions shown · non-contrast
Comparison: none

[Series 1: us mfm ob follow-up · 46 acquisitions, 14 frames shown]
[im 2/46]
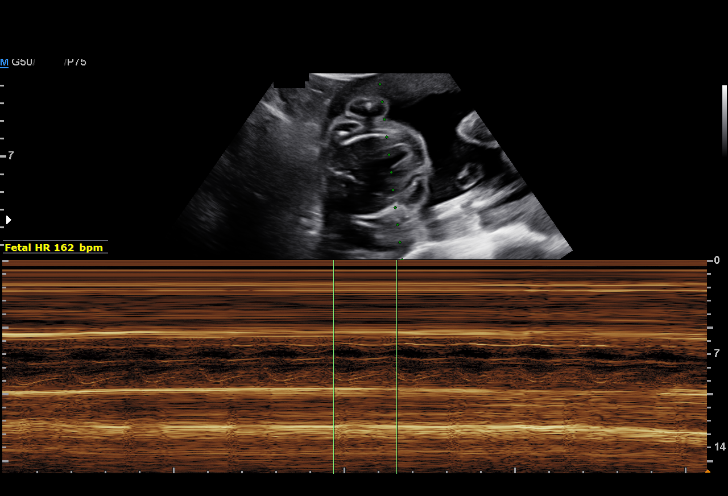
[im 6/46]
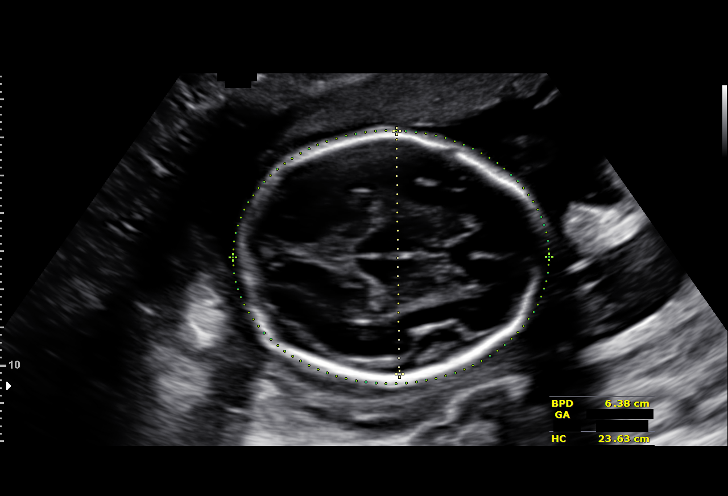
[im 9/46]
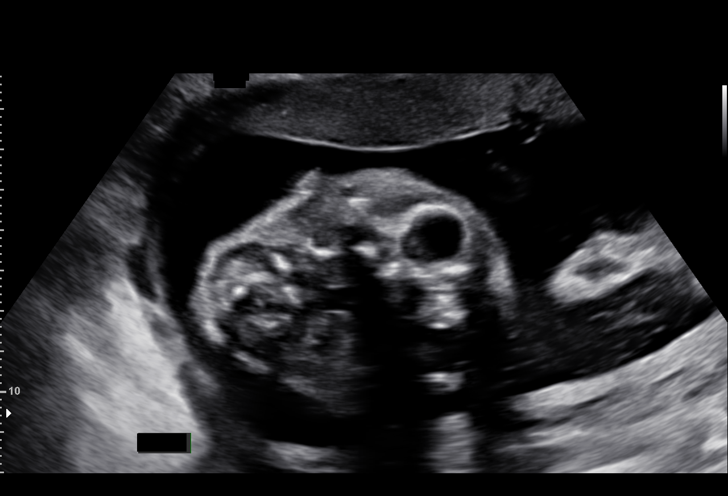
[im 12/46]
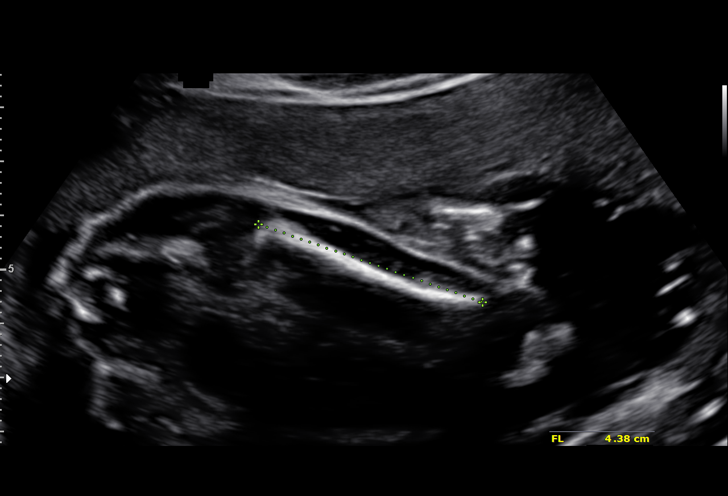
[im 16/46]
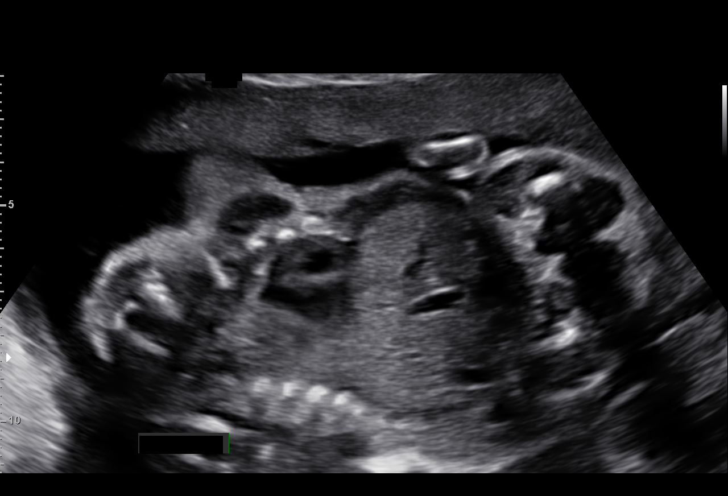
[im 19/46]
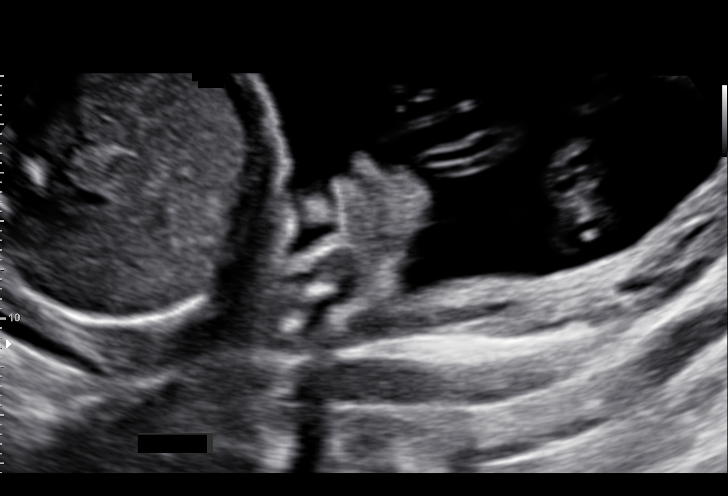
[im 22/46]
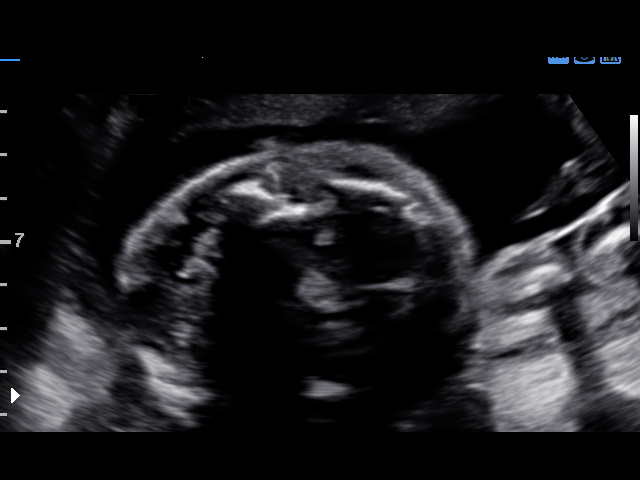
[im 26/46]
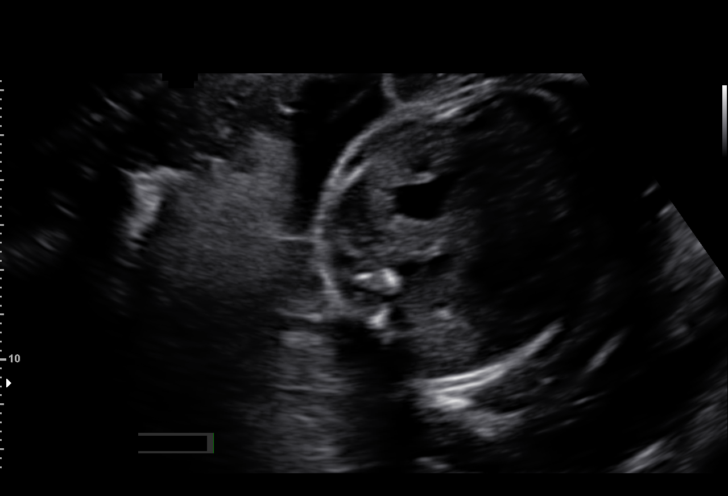
[im 29/46]
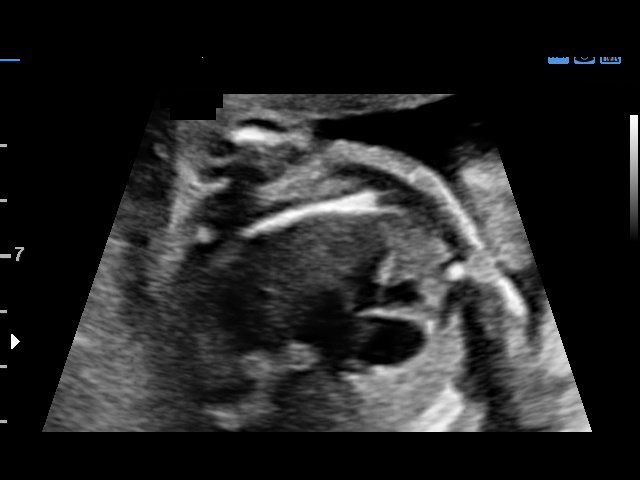
[im 32/46]
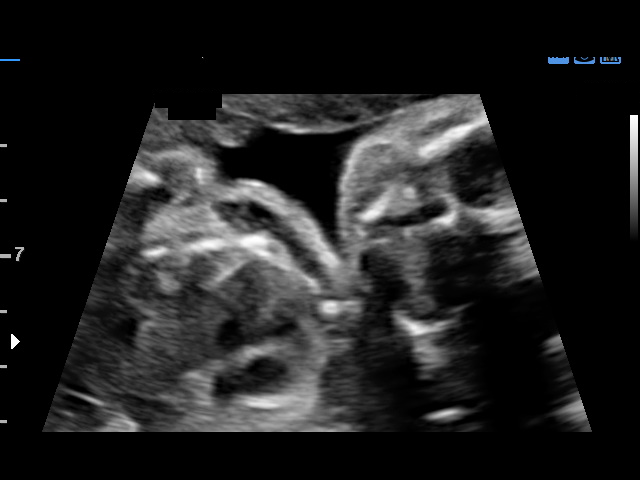
[im 36/46]
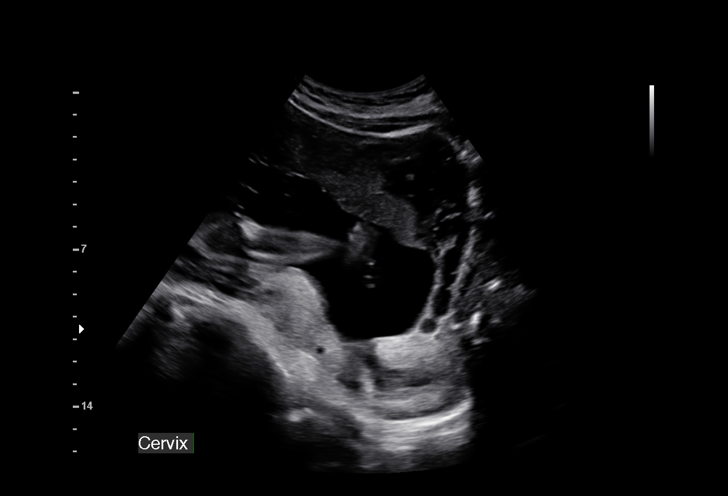
[im 39/46]
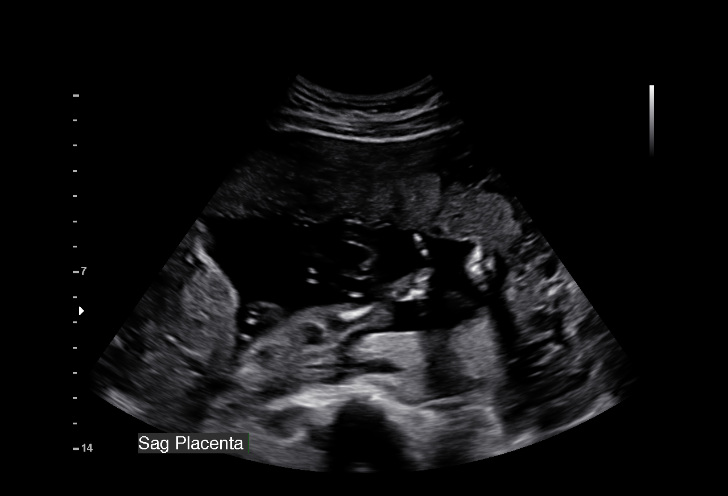
[im 42/46]
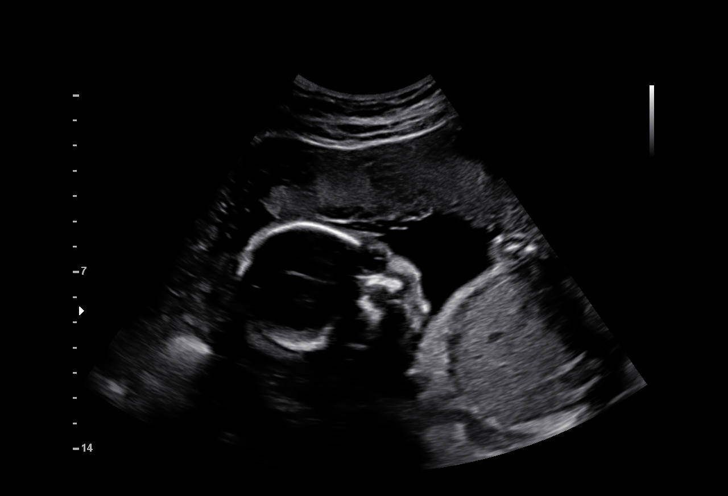
[im 46/46]
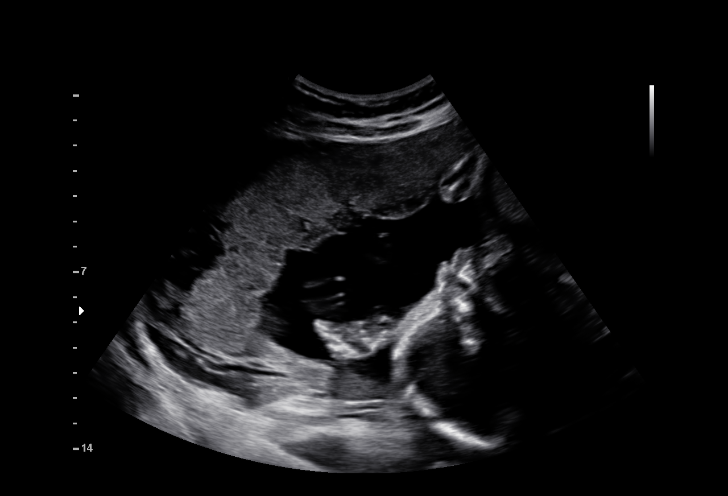

[14 of 28 positions shown; findings below may reference images not displayed]

pm)

Date:

1  DORO KATO            [id number]       [id number]     [id number]
Indications

Medical complication of pregnancy
(abnormal EKG)
Anxiety during pregnancy, second trimester      O99.342,
Rh negative state in antepartum state
24 weeks gestation of pregnancy
Follow-up incomplete fetal anatomic             Z36
evaluation
OB History

Blood Type:    A-
Gravidity:     1
Fetal Evaluation

Num Of Fetuses:      1
Fetal Heart          162
Rate(bpm):
Cardiac Activity:    Observed
Presentation:        Cephalic
Placenta:            Anterior, above cervical os
P. Cord Insertion:   Previously Visualized

Amniotic Fluid
AFI FV:      Subjectively within normal limits
Larg Pckt:      4.4  cm
Biometry

BPD:      63.5  mm     G. Age:   25w 5d                  CI:        72.34   %    70 - 86
FL/HC:      18.1   %    18.7 -
HC:      237.5  mm     G. Age:   25w 6d        87   %    HC/AC:      1.08        1.05 -
AC:      220.9  mm     G. Age:   26w 3d        95   %    FL/BPD      67.7   %    71 - 87
:
FL:         43  mm     G. Age:   24w 1d        35   %    FL/AC:      19.5   %    20 - 24
HUM:      40.3  mm     G. Age:   24w 3d        50   %

Est.         823   gm   1 lb 13 oz      76   %
FW:
Gestational Age

LMP:           24w 1d        Date:  02/08/15                  EDD:   11/15/15
U/S Today:     25w 4d                                         EDD:   11/05/15
Best:          24w 1d    Det. By:   LMP  (02/08/15)           EDD:   11/15/15
Anatomy

Cranium:          Appears normal         Aortic Arch:       Previously seen
Fetal Cavum:      Appears normal         Ductal Arch:       Previously seen
Ventricles:       Appears normal         Diaphragm:         Appears normal
Choroid Plexus:   Previously seen        Stomach:           Appears normal,
left sided
Cerebellum:       Appears normal         Abdomen:           Appears normal
Posterior         Appears normal         Abdominal          Appears nml (cord
Fossa:                                   Wall:              insert, abd wall)
Nuchal Fold:      Previously seen        Cord Vessels:      Appears normal (3
vessel cord)
Face:             Orbits nl; profile     Kidneys:           Appear normal
not well visualized
Lips:             Appears normal         Bladder:           Appears normal
Palate:           Appears normal         Spine:             Previously seen
Heart:            Appears normal         Upper              Previously seen
(4CH, axis, and        Extremities:
situs)
RVOT:             Appears normal         Lower              Previously seen
Extremities:
LVOT:             Appears normal

Other:   Female gender. Heels and 5th digit previously seen.
Cervix Uterus Adnexa

Cervix
Length:             4.2  cm.
Normal appearance by transabdominal scan. Appears closed,
without funnelling.
Impression

Single living intrauterine pregnancy at 24 weeks 1 day.
Appropriate fetal growth (76%).
Normal amniotic fluid volume.
The fetal anatomic survey is complete.
Normal fetal anatomy.
No fetal anomalies or soft markers of aneuploidy seen.
Recommendations

Follow-up ultrasounds as clinically indicated.

## 2022-08-28 ENCOUNTER — Other Ambulatory Visit: Payer: Self-pay | Admitting: Family

## 2022-08-28 ENCOUNTER — Encounter (HOSPITAL_BASED_OUTPATIENT_CLINIC_OR_DEPARTMENT_OTHER): Payer: Self-pay | Admitting: Family

## 2022-08-28 DIAGNOSIS — R1031 Right lower quadrant pain: Secondary | ICD-10-CM

## 2022-10-07 ENCOUNTER — Ambulatory Visit
Admission: RE | Admit: 2022-10-07 | Discharge: 2022-10-07 | Disposition: A | Discharge status: Home / Self Care | Payer: BC Managed Care – PPO | Source: Ambulatory Visit | Attending: Family | Admitting: Family

## 2022-10-07 DIAGNOSIS — R1031 Right lower quadrant pain: Secondary | ICD-10-CM | POA: Diagnosis not present

## 2023-02-06 ENCOUNTER — Other Ambulatory Visit: Payer: Self-pay | Admitting: Family

## 2023-02-06 DIAGNOSIS — K429 Umbilical hernia without obstruction or gangrene: Secondary | ICD-10-CM

## 2023-02-11 ENCOUNTER — Ambulatory Visit
Admission: RE | Admit: 2023-02-11 | Discharge: 2023-02-11 | Disposition: A | Discharge status: Home / Self Care | Payer: BC Managed Care – PPO | Source: Ambulatory Visit | Attending: Family | Admitting: Family

## 2023-02-11 DIAGNOSIS — K429 Umbilical hernia without obstruction or gangrene: Secondary | ICD-10-CM | POA: Insufficient documentation
